# Patient Record
Sex: Female | Born: 1970 | Race: White | Hispanic: No | Marital: Married | Smoking: Former smoker
Health system: Southern US, Community
[De-identification: ages and names within clinical notes are randomized; demographics above are authoritative.]

## PROBLEM LIST (undated history)

## (undated) DIAGNOSIS — E079 Disorder of thyroid, unspecified: Secondary | ICD-10-CM

## (undated) DIAGNOSIS — F419 Anxiety disorder, unspecified: Secondary | ICD-10-CM

## (undated) DIAGNOSIS — G971 Other reaction to spinal and lumbar puncture: Secondary | ICD-10-CM

## (undated) DIAGNOSIS — J069 Acute upper respiratory infection, unspecified: Secondary | ICD-10-CM

## (undated) DIAGNOSIS — K5190 Ulcerative colitis, unspecified, without complications: Secondary | ICD-10-CM

## (undated) DIAGNOSIS — R0600 Dyspnea, unspecified: Secondary | ICD-10-CM

## (undated) DIAGNOSIS — G43909 Migraine, unspecified, not intractable, without status migrainosus: Secondary | ICD-10-CM

## (undated) DIAGNOSIS — J45909 Unspecified asthma, uncomplicated: Secondary | ICD-10-CM

## (undated) DIAGNOSIS — E785 Hyperlipidemia, unspecified: Secondary | ICD-10-CM

## (undated) DIAGNOSIS — T4145XA Adverse effect of unspecified anesthetic, initial encounter: Secondary | ICD-10-CM

## (undated) DIAGNOSIS — G4730 Sleep apnea, unspecified: Secondary | ICD-10-CM

## (undated) DIAGNOSIS — T783XXA Angioneurotic edema, initial encounter: Secondary | ICD-10-CM

## (undated) DIAGNOSIS — L509 Urticaria, unspecified: Secondary | ICD-10-CM

## (undated) DIAGNOSIS — T8859XA Other complications of anesthesia, initial encounter: Secondary | ICD-10-CM

## (undated) DIAGNOSIS — E039 Hypothyroidism, unspecified: Secondary | ICD-10-CM

## (undated) DIAGNOSIS — K219 Gastro-esophageal reflux disease without esophagitis: Secondary | ICD-10-CM

## (undated) DIAGNOSIS — D649 Anemia, unspecified: Secondary | ICD-10-CM

## (undated) DIAGNOSIS — I10 Essential (primary) hypertension: Secondary | ICD-10-CM

## (undated) HISTORY — DX: Ulcerative colitis, unspecified, without complications: K51.90

## (undated) HISTORY — DX: Anxiety disorder, unspecified: F41.9

## (undated) HISTORY — PX: BACK SURGERY: SHX140

## (undated) HISTORY — DX: Disorder of thyroid, unspecified: E07.9

## (undated) HISTORY — DX: Migraine, unspecified, not intractable, without status migrainosus: G43.909

## (undated) HISTORY — PX: CHOLECYSTECTOMY: SHX55

## (undated) HISTORY — PX: APPENDECTOMY: SHX54

## (undated) HISTORY — DX: Angioneurotic edema, initial encounter: T78.3XXA

## (undated) HISTORY — DX: Acute upper respiratory infection, unspecified: J06.9

## (undated) HISTORY — DX: Urticaria, unspecified: L50.9

---

## 1998-08-11 HISTORY — PX: SPINE SURGERY: SHX786

## 2000-08-11 HISTORY — PX: ABDOMINAL HYSTERECTOMY: SHX81

## 2012-06-25 ENCOUNTER — Other Ambulatory Visit: Payer: Self-pay | Admitting: Family Medicine

## 2012-06-25 DIAGNOSIS — N6320 Unspecified lump in the left breast, unspecified quadrant: Secondary | ICD-10-CM

## 2012-07-02 ENCOUNTER — Ambulatory Visit
Admission: RE | Admit: 2012-07-02 | Discharge: 2012-07-02 | Disposition: A | Discharge status: Home / Self Care | Payer: BC Managed Care – PPO | Source: Ambulatory Visit | Attending: Family Medicine | Admitting: Family Medicine

## 2012-07-02 DIAGNOSIS — N6320 Unspecified lump in the left breast, unspecified quadrant: Secondary | ICD-10-CM

## 2013-06-22 DIAGNOSIS — J45909 Unspecified asthma, uncomplicated: Secondary | ICD-10-CM | POA: Insufficient documentation

## 2013-06-22 DIAGNOSIS — E039 Hypothyroidism, unspecified: Secondary | ICD-10-CM | POA: Insufficient documentation

## 2013-06-22 DIAGNOSIS — G43109 Migraine with aura, not intractable, without status migrainosus: Secondary | ICD-10-CM | POA: Insufficient documentation

## 2013-06-22 DIAGNOSIS — Z90710 Acquired absence of both cervix and uterus: Secondary | ICD-10-CM | POA: Insufficient documentation

## 2013-06-22 DIAGNOSIS — K5190 Ulcerative colitis, unspecified, without complications: Secondary | ICD-10-CM | POA: Insufficient documentation

## 2013-07-04 DIAGNOSIS — N751 Abscess of Bartholin's gland: Secondary | ICD-10-CM | POA: Insufficient documentation

## 2015-07-20 ENCOUNTER — Encounter: Payer: Self-pay | Admitting: Family Medicine

## 2015-07-20 ENCOUNTER — Ambulatory Visit (INDEPENDENT_AMBULATORY_CARE_PROVIDER_SITE_OTHER): Payer: Worker's Compensation | Admitting: Family Medicine

## 2015-07-20 VITALS — BP 124/79 | HR 74 | Temp 96.7°F | Ht 64.0 in | Wt 265.8 lb

## 2015-07-20 DIAGNOSIS — M545 Low back pain, unspecified: Secondary | ICD-10-CM

## 2015-07-20 MED ORDER — CYCLOBENZAPRINE HCL 10 MG PO TABS: 10.0000 mg | ORAL_TABLET | Freq: Three times a day (TID) | ORAL | Status: DC | PRN
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
## 2015-07-25 ENCOUNTER — Ambulatory Visit (INDEPENDENT_AMBULATORY_CARE_PROVIDER_SITE_OTHER): Payer: Worker's Compensation | Admitting: Family Medicine

## 2015-07-25 ENCOUNTER — Ambulatory Visit (INDEPENDENT_AMBULATORY_CARE_PROVIDER_SITE_OTHER): Payer: Worker's Compensation

## 2015-07-25 ENCOUNTER — Encounter: Payer: Self-pay | Admitting: Family Medicine

## 2015-07-25 VITALS — BP 119/78 | HR 105 | Temp 97.9°F | Ht 64.0 in | Wt 266.4 lb

## 2015-07-25 DIAGNOSIS — M5441 Lumbago with sciatica, right side: Secondary | ICD-10-CM

## 2015-07-25 MED ORDER — HYDROCODONE-ACETAMINOPHEN 10-325 MG PO TABS: 1.0000 | ORAL_TABLET | Freq: Three times a day (TID) | ORAL | Status: DC | PRN
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
## 2015-07-31 ENCOUNTER — Telehealth: Payer: Self-pay | Admitting: Family Medicine

## 2015-07-31 NOTE — Telephone Encounter (Signed)
Would like to try something different for pain Vicodin is causing nausea and headache Please advise

## 2015-08-01 MED ORDER — TRAMADOL HCL 50 MG PO TABS: 50.0000 mg | ORAL_TABLET | Freq: Four times a day (QID) | ORAL | Status: DC | PRN

## 2015-08-01 NOTE — Telephone Encounter (Signed)
[facility] Hyacinth MeekerMiller

## 2015-08-01 NOTE — Telephone Encounter (Signed)
I don't recall if she has tried tramadol before. We could try that 50 mg 4 times a day as needed for pain if she wants to.

## 2015-09-20 ENCOUNTER — Telehealth: Payer: Self-pay | Admitting: Family Medicine

## 2017-03-16 DIAGNOSIS — G4733 Obstructive sleep apnea (adult) (pediatric): Secondary | ICD-10-CM | POA: Insufficient documentation

## 2017-04-24 ENCOUNTER — Other Ambulatory Visit: Payer: Self-pay | Admitting: Rehabilitation

## 2017-04-24 DIAGNOSIS — M47816 Spondylosis without myelopathy or radiculopathy, lumbar region: Secondary | ICD-10-CM

## 2017-05-06 ENCOUNTER — Ambulatory Visit
Admission: RE | Admit: 2017-05-06 | Discharge: 2017-05-06 | Disposition: A | Discharge status: Home / Self Care | Payer: BC Managed Care – PPO | Source: Ambulatory Visit | Attending: Rehabilitation | Admitting: Rehabilitation

## 2017-05-06 DIAGNOSIS — M47816 Spondylosis without myelopathy or radiculopathy, lumbar region: Secondary | ICD-10-CM

## 2017-05-06 MED ORDER — GADOBENATE DIMEGLUMINE 529 MG/ML IV SOLN
20.0000 mL | Freq: Once | INTRAVENOUS | Status: AC | PRN
  Administered 2017-05-06: 20 mL via INTRAVENOUS

## 2017-08-11 HISTORY — PX: BACK SURGERY: SHX140

## 2018-08-19 ENCOUNTER — Other Ambulatory Visit: Payer: Self-pay | Admitting: Orthopedic Surgery

## 2018-08-24 NOTE — Pre-Procedure Instructions (Signed)
Julie Hess  08/24/2018      [facility] DRUG STORE #[id number] [place], [place] - [address] AT [facility] [address] MA[address] [place] [address] Phone: [phone] Fax: [phone]    Your procedure is scheduled on January 16  Report to [facility] Admitting at [employer] A.M. per dr. Yevette Hess  Call this number if you have problems the morning of surgery:  [phone]   Remember:  Do not eat or drink after midnight.      Take these medicines the morning of surgery with A SIP OF WATER  busPIRone (BUSPAR)  fluticasone (FLOVENT HFA) levothyroxine (SYNTHROID, LEVOTHROID) omeprazole (PRILOSEC OTC)  PROAIR HFA  7 days prior to surgery STOP taking any Aspirin (unless otherwise instructed by your surgeon), Aleve, Naproxen, Ibuprofen, Motrin, Advil, Goody's, BC's, all herbal medications, fish oil, and all vitamins sulfaSALAzine (AZULFIDINE)     Do not wear jewelry, make-up or nail polish.  Do not wear lotions, powders, or perfumes, or deodorant.  Do not shave 48 hours prior to surgery.    Do not bring valuables to the hospital.  [facility] is not responsible for any belongings or valuables.  Contacts, dentures or bridgework may not be worn into surgery.  Leave your suitcase in the car.  After surgery it may be brought to your room.  For patients admitted to the hospital, discharge time will be determined by your treatment team.  Patients discharged the day of surgery will not be allowed to drive home.    Special instructions:   - Preparing For Surgery  Before surgery, you can play an important role. Because skin is not sterile, your skin needs to be as free of germs as possible. You can reduce the number of germs on your skin by washing with CHG (chlorahexidine gluconate) Soap before surgery.  CHG is an antiseptic cleaner which kills germs and bonds with the skin to continue killing germs even after washing.    Oral Hygiene is  also important to reduce your risk of infection.  Remember - BRUSH YOUR TEETH THE MORNING OF SURGERY WITH YOUR REGULAR TOOTHPASTE  Please do not use if you have an allergy to CHG or antibacterial soaps. If your skin becomes reddened/irritated stop using the CHG.  Do not shave (including legs and underarms) for at least 48 hours prior to first CHG shower. It is OK to shave your face.  Please follow these instructions carefully.   1. Shower the NIGHT BEFORE SURGERY and the MORNING OF SURGERY with CHG.   2. If you chose to wash your hair, wash your hair first as usual with your normal shampoo.  3. After you shampoo, rinse your hair and body thoroughly to remove the shampoo.  4. Use CHG as you would any other liquid soap. You can apply CHG directly to the skin and wash gently with a scrungie or a clean washcloth.   5. Apply the CHG Soap to your body ONLY FROM THE NECK DOWN.  Do not use on open wounds or open sores. Avoid contact with your eyes, ears, mouth and genitals (private parts). Wash Face and genitals (private parts)  with your normal soap.  6. Wash thoroughly, paying special attention to the area where your surgery will be performed.  7. Thoroughly rinse your body with warm water from the neck down.  8. DO NOT shower/wash with your normal soap after using and rinsing off the CHG Soap.  9. Pat yourself dry with a CLEAN TOWEL.  10. Wear CLEAN PAJAMAS to bed the night before surgery, wear comfortable clothes the morning of surgery  11. Place CLEAN SHEETS on your bed the night of your first shower and DO NOT SLEEP WITH PETS.    Day of Surgery:  Do not apply any deodorants/lotions.  Please wear clean clothes to the hospital/surgery center.   Remember to brush your teeth WITH YOUR REGULAR TOOTHPASTE.    Please read over the following fact sheets that you were given.

## 2018-08-25 ENCOUNTER — Encounter (HOSPITAL_COMMUNITY): Payer: Self-pay

## 2018-08-25 ENCOUNTER — Other Ambulatory Visit: Payer: Self-pay

## 2018-08-25 ENCOUNTER — Encounter (HOSPITAL_COMMUNITY)
Admission: RE | Admit: 2018-08-25 | Discharge: 2018-08-25 | Disposition: A | Discharge status: Home / Self Care | Payer: No Typology Code available for payment source | Source: Ambulatory Visit | Attending: Orthopedic Surgery | Admitting: Orthopedic Surgery

## 2018-08-25 DIAGNOSIS — Z01812 Encounter for preprocedural laboratory examination: Secondary | ICD-10-CM | POA: Insufficient documentation

## 2018-08-25 HISTORY — DX: Adverse effect of unspecified anesthetic, initial encounter: T41.45XA

## 2018-08-25 HISTORY — DX: Hyperlipidemia, unspecified: E78.5

## 2018-08-25 HISTORY — DX: Dyspnea, unspecified: R06.00

## 2018-08-25 HISTORY — DX: Other complications of anesthesia, initial encounter: T88.59XA

## 2018-08-25 HISTORY — DX: Sleep apnea, unspecified: G47.30

## 2018-08-25 HISTORY — DX: Essential (primary) hypertension: I10

## 2018-08-25 HISTORY — DX: Gastro-esophageal reflux disease without esophagitis: K21.9

## 2018-08-25 HISTORY — DX: Hypothyroidism, unspecified: E03.9

## 2018-08-25 LAB — URINALYSIS, ROUTINE W REFLEX MICROSCOPIC
BILIRUBIN URINE: NEGATIVE
Glucose, UA: NEGATIVE mg/dL
HGB URINE DIPSTICK: NEGATIVE
Ketones, ur: NEGATIVE mg/dL
Nitrite: NEGATIVE
Protein, ur: NEGATIVE mg/dL
Specific Gravity, Urine: 1.006 (ref 1.005–1.030)
WBC, UA: >50 WBC/hpf — ABNORMAL HIGH (ref 0–5)
pH: 6 (ref 5.0–8.0)

## 2018-08-25 LAB — COMPREHENSIVE METABOLIC PANEL
ALT: 23 U/L (ref 0–44)
AST: 22 U/L (ref 15–41)
Albumin: 3.6 g/dL (ref 3.5–5.0)
Alkaline Phosphatase: 109 U/L (ref 38–126)
Anion gap: 11 (ref 5–15)
BILIRUBIN TOTAL: 0.5 mg/dL (ref 0.3–1.2)
BUN: 8 mg/dL (ref 6–20)
CO2: 25 mmol/L (ref 22–32)
Calcium: 9 mg/dL (ref 8.9–10.3)
Chloride: 105 mmol/L (ref 98–111)
Creatinine, Ser: 0.81 mg/dL (ref 0.44–1.00)
GFR calc Af Amer: >60 mL/min (ref >60)
GFR calc non Af Amer: >60 mL/min (ref >60)
Glucose, Bld: 94 mg/dL (ref 70–99)
Potassium: 3.9 mmol/L (ref 3.5–5.1)
Sodium: 141 mmol/L (ref 135–145)
Total Protein: 7.2 g/dL (ref 6.5–8.1)

## 2018-08-25 LAB — CBC WITH DIFFERENTIAL/PLATELET
Abs Immature Granulocytes: 0.04 10*3/uL (ref 0.00–0.07)
Basophils Absolute: 0.1 10*3/uL (ref 0.0–0.1)
Basophils Relative: 1 %
Eosinophils Absolute: 0.2 10*3/uL (ref 0.0–0.5)
Eosinophils Relative: 2 %
HCT: 41.6 % (ref 36.0–46.0)
Hemoglobin: 13.1 g/dL (ref 12.0–15.0)
Immature Granulocytes: 1 %
LYMPHS ABS: 2.8 10*3/uL (ref 0.7–4.0)
Lymphocytes Relative: 38 %
MCH: 28.4 pg (ref 26.0–34.0)
MCHC: 31.5 g/dL (ref 30.0–36.0)
MCV: 90 fL (ref 80.0–100.0)
Monocytes Absolute: 0.5 10*3/uL (ref 0.1–1.0)
Monocytes Relative: 6 %
NRBC: 0 % (ref 0.0–0.2)
Neutro Abs: 3.8 10*3/uL (ref 1.7–7.7)
Neutrophils Relative %: 52 %
Platelets: 307 10*3/uL (ref 150–400)
RBC: 4.62 MIL/uL (ref 3.87–5.11)
RDW: 14.8 % (ref 11.5–15.5)
WBC: 7.3 10*3/uL (ref 4.0–10.5)

## 2018-08-25 LAB — SURGICAL PCR SCREEN
MRSA, PCR: NEGATIVE
Staphylococcus aureus: NEGATIVE

## 2018-08-25 LAB — ABO/RH: ABO/RH(D): A POS

## 2018-08-25 LAB — TYPE AND SCREEN
ABO/RH(D): A POS
Antibody Screen: NEGATIVE

## 2018-08-25 LAB — APTT: aPTT: 32 seconds (ref 24–36)

## 2018-08-25 LAB — PROTIME-INR
INR: 1.02
Prothrombin Time: 13.3 seconds (ref 11.4–15.2)

## 2018-08-25 MED ORDER — VANCOMYCIN HCL 10 G IV SOLR
1500.0000 mg | INTRAVENOUS | Status: AC
  Administered 2018-08-26: 1500 mg via INTRAVENOUS
  Filled 2018-08-25: qty 1500

## 2018-08-25 NOTE — Progress Notes (Signed)
Anesthesia Chart Review:  Case:  [id number] Date/Time:  08/26/18 1419   Procedure:  ANTERIOR CERVICAL DECOMPRESSION FUSION, CERVICAL 4-5, CERVICAL 5-6, CERVICAL 6-7 WITH INSTRUMENTATION AND ALLOGRAFT (N/A )   Anesthesia type:  General   Pre-op diagnosis:  RIGHT ARM PAIN ALMOST 3 MONTHS AFTER HER INJURY MRI DOES REVEAL DEGENERATIVE CHANGES WITH COMPRESSION OF THE SPINAL CORD IDENTIFIED AT C4-5, C5-6 AND C6-7   Location:  [facility] OR ROOM 05 / [facility] OR   Surgeon:  Estill Bambergumonski, Mark, MD      DISCUSSION: Patient is a 48 year old female scheduled for the above procedure.   History includes former smoker (quit '06), ulcerative colitis, exertional dyspnea, HTN, OSA (CPAP), hypothyroidism, GERD, HLD, hysterectomy 2002, back surgery 2019.  Patient was seen by cardiologist Dr. Yetta Barreinehart for evaluation of chest pain. She had an abnormal stress echo in December that (by notes) showed a moderate size post exercise wall motion abnormality in the anteroseptal wall suggestive of myocardail ischemia. He saw her in follow-up with no additional testing ordered. He started her on ASA (on hold for surgery) and losartan with plans for 3 month follow-up. Patient reports her symptoms improved once she was placed on Flovent. Due to the abnormal stress echo I called and spoke with Dr. Yetta Barreinehart personally. He felt patient was "stable" to proceed with surgery and did not recommend that she have a cardiac cath prior to surgery.    Above reviewed with anesthesiologist Autumn PattyFitzgerald, Edmond, MD. Cardiologist did not recommend additional testing prior to surgery. Patient denied SOB, cough, fever, and chest pain at PAT. If patient without acute changes then would anticipate proceeding as planned, but definitive plan following her assigned anesthesiologist evaluation on the day of surgery.  Still awaiting formal copy of recent stress echo and [facility]    VS: BP 124/66   Pulse 74   Temp 36.7 C   Resp 20   Ht 5\' 5"  (1.651 m)    Wt (!) 137.5 kg   SpO2 100%   BMI 50.46 kg/m    PROVIDERS: Rolan Buccoompton, Kimberly D., PA-C is PCP - Chapman Fitchinehart, Kenneth, MD is cardiologist [facility]). He first saw her on 06/14/18 for evaluation for chest pain--"describes exertional chest discomfort with moderate exertion." He ordered a BNP, D-dimer, EKG, and stress echo. Last visit 07/27/18 for follow-up studies. BNP 21.7, D-dimer elevated at 0.64 which lead to a VQ scan which was low probability for PE. According to his note, her stress echo was abnormal. He started her on losartan, ASA and recommended 3 month follow-up. - [facility].   LABS: Preoperative labs noted. CBC, CMET, PT/PTT WNL. UA showed moderate leukocytes, many bacteria (message left for Lupita LeashDonna at Dr. Marshell Levanumonski's office).  (all labs ordered are listed, but only abnormal results are displayed)  Labs Reviewed  URINALYSIS, ROUTINE W REFLEX MICROSCOPIC - Abnormal; Notable for the following components:      Result Value   APPearance HAZY (*)    Leukocytes, UA MODERATE (*)    WBC, UA >50 (*)    Bacteria, UA MANY (*)    All other components within normal limits  SURGICAL PCR SCREEN  APTT  CBC WITH DIFFERENTIAL/PLATELET  COMPREHENSIVE METABOLIC PANEL  PROTIME-INR  TYPE AND SCREEN  ABO/RH    IMAGES: CXR 06/24/18 ([facility]) FINDINGS:  No acute infiltrate, pleural effusion or pneumothorax. The cardiomediastinal silhouette is normal in size and contour. IMPRESSION: No acute cardiopulmonary disease.  VQ  scan 06/24/18 [facility]): IMPRESSION: 1. 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 C4-5 mild central canal stenosis with mild left neural foraminal narrowing.  MRI L-spine 05/27/18: IMPRESSION: Left laminotomy decompression at L4-5 and L5-S1 with well-maintained central canal and neuroforamina.  There are fluid collection seen in the laminectomy defect measuring 3.4 x 2.6 x 4.6 cm as well as more posteriorly in the subcutaneous fat measuring 1.5  x 1.4 x 4.6 cm. These collections may reflect seroma however sterility is indeterminate   EKG: 06/14/18 [facility]): Tracing requested, but according to Result Impression: SR, low voltage in precordial leads.    CV: Stress Echo 07/13/18 [facility]): Report requested, but according to Dr. Brigitte Pulseinehart's 07/27/18 note, "Stress echocardiogram 07/2018. Duke treadmill score +5. Normal LV EF 50-55%. Abnormal stress echo. Moderate size post exercise wall motion abnormality in the anteroseptal wall suggestive of myocardail ischemia.   Past Medical History:  Diagnosis Date  . Anxiety   . Complication of anesthesia    Hard time waking up after C-Section 1997, but tolerated anesthesia since then no issues  . Dyspnea    with activity  . GERD (gastroesophageal reflux disease)   . Hyperlipidemia   . Hypertension   . Hypothyroidism   . Migraines    migraines  . Sleep apnea   . Thyroid disease   . Ulcerative colitis, chronic (HCC)     Past Surgical History:  Procedure Laterality Date  . ABDOMINAL HYSTERECTOMY  2002  . APPENDECTOMY    . BACK SURGERY  2019   lumbar fusion  . BACK SURGERY     discetomy  . CESAREAN SECTION  1996  . CHOLECYSTECTOMY    . SPINE SURGERY  [id number]   lumbar    MEDICATIONS: . aspirin EC 81 MG tablet  . busPIRone (BUSPAR) 10 MG tablet  . ELDERBERRY PO  . estradiol (VIVELLE-DOT) 0.05 MG/24HR patch  . fluticasone (FLOVENT HFA) 44 MCG/ACT inhaler  . levothyroxine (SYNTHROID, LEVOTHROID) 75 MCG tablet  . losartan (COZAAR) 50 MG tablet  . omeprazole (PRILOSEC OTC) 20 MG tablet  . pravastatin (PRAVACHOL) 40 MG tablet  . PROAIR HFA 108 (90 BASE) MCG/ACT inhaler  . sulfaSALAzine (AZULFIDINE) 500 MG tablet   No current facility-administered medications for this encounter.     Shonna ChockAllison Marge Vandermeulen,  PA-C Surgical Short Stay/Anesthesiology [facility] Phone [phone] [facility] Phone [phone] 08/25/2018 1:40 PM

## 2018-08-25 NOTE — Progress Notes (Signed)
Dr. Marshell Levanumonski's office notified about patient's abnormal UA. Spoke with Joni ReiningNicole and she stated she will notify Dr. Marshell Levanumonski's about result.

## 2018-08-25 NOTE — Progress Notes (Addendum)
PCP: Osborne Casco PA-C, sees q69months  Cardiologist: Dr. Chapman Fitch  EKG: 06-14-18- requested tracing CXR: 06-24-18, [facility] Every[facility] ECHO: 07-13-18 C. E. Stress Test: 07-13-18 C. E. Cardiac Cath: Denies Sleep Study: 03-2017, Care Everywhere--wears CPAP at night, range of settings between 11-13  [facility] and left message to request cardiac clearance if needed.   Orvan Seen with anesthesia called for review.  Per pt, due to recent colonoscopy with biopsies, she cannot have NSAIDS until 08-31-2018 per GI Dr. Gasper Lloyd.   Patient denies shortness of breath, fever, cough, and chest pain at PAT appointment.  Patient verbalized understanding of instructions provided today at the PAT appointment.  Patient asked to review instructions at home and day of surgery.

## 2018-08-25 NOTE — Pre-Procedure Instructions (Signed)
Julie Hess  08/25/2018      [facility] DRUG STORE #[id number] [name], [place] - [address] AT [facility] [address] [place] [address] Phone: [phone] Fax: [phone]    Your procedure is scheduled on January 16  Report to [facility] Admitting at [employer] A.M. per Dr. Yevette Edwards  Call this number if you have problems the morning of surgery:  [phone]   Remember:  Do not eat or drink after midnight.      Take these medicines the morning of surgery with A SIP OF WATER  busPIRone (BUSPAR)  fluticasone (FLOVENT HFA) levothyroxine (SYNTHROID, LEVOTHROID) omeprazole (PRILOSEC OTC)  PROAIR HFA as needed and bring with you the day of surgery  As of today, STOP taking any Aspirin (unless otherwise instructed by your surgeon), Aleve, Naproxen, Ibuprofen, Motrin, Advil, Goody's, BC's, all herbal medications, fish oil, and all vitamins sulfaSALAzine (AZULFIDINE)     Do not wear jewelry, make-up or nail polish.  Do not wear lotions, powders, or perfumes, or deodorant.  Do not shave 48 hours prior to surgery.    Do not bring valuables to the hospital.  [facility] is not responsible for any belongings or valuables.  Contacts, dentures or bridgework may not be worn into surgery.  Leave your suitcase in the car.  After surgery it may be brought to your room.  For patients admitted to the hospital, discharge time will be determined by your treatment team.  Patients discharged the day of surgery will not be allowed to drive home.    Special instructions:   [place]- Preparing For Surgery  Before surgery, you can play an important role. Because skin is not sterile, your skin needs to be as free of germs as possible. You can reduce the number of germs on your skin by washing with CHG (chlorahexidine gluconate) Soap before surgery.  CHG is an antiseptic cleaner which kills germs and bonds with the skin to continue killing germs  even after washing.    Oral Hygiene is also important to reduce your risk of infection.  Remember - BRUSH YOUR TEETH THE MORNING OF SURGERY WITH YOUR REGULAR TOOTHPASTE  Please do not use if you have an allergy to CHG or antibacterial soaps. If your skin becomes reddened/irritated stop using the CHG.  Do not shave (including legs and underarms) for at least 48 hours prior to first CHG shower. It is OK to shave your face.  Please follow these instructions carefully.   1. Shower the NIGHT BEFORE SURGERY and the MORNING OF SURGERY with CHG.   2. If you chose to wash your hair, wash your hair first as usual with your normal shampoo.  3. After you shampoo, rinse your hair and body thoroughly to remove the shampoo.  4. Use CHG as you would any other liquid soap. You can apply CHG directly to the skin and wash gently with a scrungie or a clean washcloth.   5. Apply the CHG Soap to your body ONLY FROM THE NECK DOWN.  Do not use on open wounds or open sores. Avoid contact with your eyes, ears, mouth and genitals (private parts). Wash Face and genitals (private parts)  with your normal soap.  6. Wash thoroughly, paying special attention to the area where your surgery will be performed.  7. Thoroughly rinse your body with warm water from the neck down.  8. DO NOT shower/wash with your normal soap  after using and rinsing off the CHG Soap.  9. Pat yourself dry with a CLEAN TOWEL.  10. Wear CLEAN PAJAMAS to bed the night before surgery, wear comfortable clothes the morning of surgery  11. Place CLEAN SHEETS on your bed the night of your first shower and DO NOT SLEEP WITH PETS.    Day of Surgery:  Do not apply any deodorants/lotions.  Please wear clean clothes to the hospital/surgery center.   Remember to brush your teeth WITH YOUR REGULAR TOOTHPASTE.    Please read over the following fact sheets that you were given.

## 2018-08-26 ENCOUNTER — Ambulatory Visit (HOSPITAL_COMMUNITY): Payer: No Typology Code available for payment source | Admitting: Vascular Surgery

## 2018-08-26 ENCOUNTER — Ambulatory Visit (HOSPITAL_COMMUNITY): Payer: No Typology Code available for payment source

## 2018-08-26 ENCOUNTER — Encounter (HOSPITAL_COMMUNITY): Payer: Self-pay | Admitting: Certified Registered Nurse Anesthetist

## 2018-08-26 ENCOUNTER — Encounter (HOSPITAL_COMMUNITY)
Admission: RE | Disposition: A | Discharge status: Home / Self Care | Payer: Self-pay | Source: Home / Self Care | Attending: Orthopedic Surgery

## 2018-08-26 ENCOUNTER — Observation Stay (HOSPITAL_COMMUNITY)
Admission: RE | Admit: 2018-08-26 | Discharge: 2018-08-27 | Disposition: A | Discharge status: Home / Self Care | Payer: No Typology Code available for payment source | Attending: Orthopedic Surgery | Admitting: Orthopedic Surgery

## 2018-08-26 ENCOUNTER — Ambulatory Visit (HOSPITAL_COMMUNITY): Payer: No Typology Code available for payment source | Admitting: Certified Registered Nurse Anesthetist

## 2018-08-26 DIAGNOSIS — Z79890 Hormone replacement therapy (postmenopausal): Secondary | ICD-10-CM | POA: Insufficient documentation

## 2018-08-26 DIAGNOSIS — K219 Gastro-esophageal reflux disease without esophagitis: Secondary | ICD-10-CM | POA: Insufficient documentation

## 2018-08-26 DIAGNOSIS — F419 Anxiety disorder, unspecified: Secondary | ICD-10-CM | POA: Insufficient documentation

## 2018-08-26 DIAGNOSIS — M79601 Pain in right arm: Secondary | ICD-10-CM | POA: Diagnosis present

## 2018-08-26 DIAGNOSIS — Z7951 Long term (current) use of inhaled steroids: Secondary | ICD-10-CM | POA: Diagnosis not present

## 2018-08-26 DIAGNOSIS — I10 Essential (primary) hypertension: Secondary | ICD-10-CM | POA: Insufficient documentation

## 2018-08-26 DIAGNOSIS — E785 Hyperlipidemia, unspecified: Secondary | ICD-10-CM | POA: Diagnosis not present

## 2018-08-26 DIAGNOSIS — Z9989 Dependence on other enabling machines and devices: Secondary | ICD-10-CM | POA: Insufficient documentation

## 2018-08-26 DIAGNOSIS — Z79899 Other long term (current) drug therapy: Secondary | ICD-10-CM | POA: Diagnosis not present

## 2018-08-26 DIAGNOSIS — M5412 Radiculopathy, cervical region: Secondary | ICD-10-CM | POA: Diagnosis not present

## 2018-08-26 DIAGNOSIS — Z87891 Personal history of nicotine dependence: Secondary | ICD-10-CM | POA: Insufficient documentation

## 2018-08-26 DIAGNOSIS — E039 Hypothyroidism, unspecified: Secondary | ICD-10-CM | POA: Diagnosis not present

## 2018-08-26 DIAGNOSIS — Z6841 Body Mass Index (BMI) 40.0 and over, adult: Secondary | ICD-10-CM | POA: Insufficient documentation

## 2018-08-26 DIAGNOSIS — M4802 Spinal stenosis, cervical region: Principal | ICD-10-CM | POA: Insufficient documentation

## 2018-08-26 DIAGNOSIS — Z7982 Long term (current) use of aspirin: Secondary | ICD-10-CM | POA: Diagnosis not present

## 2018-08-26 DIAGNOSIS — G4733 Obstructive sleep apnea (adult) (pediatric): Secondary | ICD-10-CM | POA: Diagnosis not present

## 2018-08-26 DIAGNOSIS — Z419 Encounter for procedure for purposes other than remedying health state, unspecified: Secondary | ICD-10-CM

## 2018-08-26 DIAGNOSIS — M5410 Radiculopathy, site unspecified: Secondary | ICD-10-CM | POA: Diagnosis present

## 2018-08-26 HISTORY — PX: ANTERIOR CERVICAL DECOMPRESSION/DISCECTOMY FUSION 4 LEVEL/HARDWARE REMOVAL: SHX5551

## 2018-08-26 SURGERY — ANTERIOR CERVICAL DECOMPRESSION/DISCECTOMY FUSION 4 LEVEL/HARDWARE REMOVAL
Anesthesia: General

## 2018-08-26 MED ORDER — THROMBIN 20000 UNITS EX SOLR
CUTANEOUS | Status: DC | PRN
  Administered 2018-08-26: 20000 [IU] via TOPICAL

## 2018-08-26 MED ORDER — ONDANSETRON HCL 4 MG/2ML IJ SOLN: 4.0000 mg | Freq: Four times a day (QID) | INTRAMUSCULAR | Status: DC | PRN

## 2018-08-26 MED ORDER — ACETAMINOPHEN 325 MG PO TABS: 650.0000 mg | ORAL_TABLET | ORAL | Status: DC | PRN

## 2018-08-26 MED ORDER — OXYCODONE HCL 5 MG/5ML PO SOLN: 5.0000 mg | Freq: Once | ORAL | Status: DC | PRN

## 2018-08-26 MED ORDER — EPHEDRINE 5 MG/ML INJ
INTRAVENOUS | Status: AC
  Filled 2018-08-26: qty 10

## 2018-08-26 MED ORDER — SENNOSIDES-DOCUSATE SODIUM 8.6-50 MG PO TABS
1.0000 | ORAL_TABLET | Freq: Every evening | ORAL | Status: DC | PRN
  Filled 2018-08-26: qty 1

## 2018-08-26 MED ORDER — LEVOFLOXACIN 500 MG PO TABS
500.0000 mg | ORAL_TABLET | Freq: Every day | ORAL | Status: DC
  Administered 2018-08-27: 500 mg via ORAL
  Filled 2018-08-26: qty 1

## 2018-08-26 MED ORDER — BISACODYL 5 MG PO TBEC: 5.0000 mg | DELAYED_RELEASE_TABLET | Freq: Every day | ORAL | Status: DC | PRN

## 2018-08-26 MED ORDER — FENTANYL CITRATE (PF) 100 MCG/2ML IJ SOLN
INTRAMUSCULAR | Status: DC | PRN
  Administered 2018-08-26: 150 ug via INTRAVENOUS
  Administered 2018-08-26 (×2): 50 ug via INTRAVENOUS

## 2018-08-26 MED ORDER — SUCCINYLCHOLINE CHLORIDE 200 MG/10ML IV SOSY
PREFILLED_SYRINGE | INTRAVENOUS | Status: AC
  Filled 2018-08-26: qty 10

## 2018-08-26 MED ORDER — HYDROMORPHONE HCL 1 MG/ML IJ SOLN
0.2500 mg | INTRAMUSCULAR | Status: DC | PRN
  Administered 2018-08-26 (×3): 0.5 mg via INTRAVENOUS

## 2018-08-26 MED ORDER — BUSPIRONE HCL 10 MG PO TABS
10.0000 mg | ORAL_TABLET | Freq: Two times a day (BID) | ORAL | Status: DC
  Administered 2018-08-26 – 2018-08-27 (×2): 10 mg via ORAL
  Filled 2018-08-26 (×3): qty 1

## 2018-08-26 MED ORDER — ALBUTEROL SULFATE (2.5 MG/3ML) 0.083% IN NEBU
2.5000 mg | INHALATION_SOLUTION | RESPIRATORY_TRACT | Status: DC
  Filled 2018-08-26: qty 3

## 2018-08-26 MED ORDER — LACTATED RINGERS IV SOLN
INTRAVENOUS | Status: DC
  Administered 2018-08-26: 12:00:00 via INTRAVENOUS

## 2018-08-26 MED ORDER — OMEPRAZOLE MAGNESIUM 20 MG PO TBEC: 20.0000 mg | DELAYED_RELEASE_TABLET | Freq: Every day | ORAL | Status: DC

## 2018-08-26 MED ORDER — SODIUM CHLORIDE 0.9 % IV SOLN
INTRAVENOUS | Status: DC | PRN
  Administered 2018-08-26: 40 ug/min via INTRAVENOUS

## 2018-08-26 MED ORDER — ALBUTEROL SULFATE HFA 108 (90 BASE) MCG/ACT IN AERS
2.0000 | INHALATION_SPRAY | RESPIRATORY_TRACT | Status: DC | PRN
  Filled 2018-08-26: qty 6.7

## 2018-08-26 MED ORDER — ONDANSETRON HCL 4 MG/2ML IJ SOLN
INTRAMUSCULAR | Status: DC | PRN
  Administered 2018-08-26: 4 mg via INTRAVENOUS

## 2018-08-26 MED ORDER — FENTANYL CITRATE (PF) 250 MCG/5ML IJ SOLN
INTRAMUSCULAR | Status: AC
  Filled 2018-08-26: qty 5

## 2018-08-26 MED ORDER — POVIDONE-IODINE 7.5 % EX SOLN
Freq: Once | CUTANEOUS | Status: DC
  Filled 2018-08-26: qty 118

## 2018-08-26 MED ORDER — ROCURONIUM BROMIDE 10 MG/ML (PF) SYRINGE
PREFILLED_SYRINGE | INTRAVENOUS | Status: DC | PRN
  Administered 2018-08-26: 80 mg via INTRAVENOUS
  Administered 2018-08-26: 20 mg via INTRAVENOUS

## 2018-08-26 MED ORDER — ACETAMINOPHEN 650 MG RE SUPP: 650.0000 mg | RECTAL | Status: DC | PRN

## 2018-08-26 MED ORDER — OXYCODONE HCL 5 MG PO TABS: 5.0000 mg | ORAL_TABLET | Freq: Once | ORAL | Status: DC | PRN

## 2018-08-26 MED ORDER — ESMOLOL HCL 100 MG/10ML IV SOLN
INTRAVENOUS | Status: DC | PRN
  Administered 2018-08-26 (×2): 10 mg via INTRAVENOUS

## 2018-08-26 MED ORDER — BUPIVACAINE-EPINEPHRINE 0.25% -1:200000 IJ SOLN
INTRAMUSCULAR | Status: DC | PRN
  Administered 2018-08-26: 10 mL

## 2018-08-26 MED ORDER — BUDESONIDE 0.5 MG/2ML IN SUSP
0.5000 mg | Freq: Two times a day (BID) | RESPIRATORY_TRACT | Status: DC
  Administered 2018-08-27: 0.5 mg via RESPIRATORY_TRACT
  Filled 2018-08-26 (×3): qty 2

## 2018-08-26 MED ORDER — DOCUSATE SODIUM 100 MG PO CAPS
100.0000 mg | ORAL_CAPSULE | Freq: Two times a day (BID) | ORAL | Status: DC
  Administered 2018-08-26 – 2018-08-27 (×2): 100 mg via ORAL
  Filled 2018-08-26 (×2): qty 1

## 2018-08-26 MED ORDER — SODIUM CHLORIDE 0.9% FLUSH: 3.0000 mL | Freq: Two times a day (BID) | INTRAVENOUS | Status: DC

## 2018-08-26 MED ORDER — ONDANSETRON HCL 4 MG/2ML IJ SOLN
INTRAMUSCULAR | Status: AC
  Administered 2018-08-26: 4 mg via INTRAVENOUS
  Filled 2018-08-26: qty 2

## 2018-08-26 MED ORDER — ONDANSETRON HCL 4 MG/2ML IJ SOLN
4.0000 mg | Freq: Once | INTRAMUSCULAR | Status: AC | PRN
  Administered 2018-08-26: 4 mg via INTRAVENOUS

## 2018-08-26 MED ORDER — BUPIVACAINE-EPINEPHRINE (PF) 0.25% -1:200000 IJ SOLN
INTRAMUSCULAR | Status: AC
  Filled 2018-08-26: qty 30

## 2018-08-26 MED ORDER — ONDANSETRON HCL 4 MG/2ML IJ SOLN
INTRAMUSCULAR | Status: AC
  Filled 2018-08-26: qty 2

## 2018-08-26 MED ORDER — MIDAZOLAM HCL 2 MG/2ML IJ SOLN
INTRAMUSCULAR | Status: AC
  Filled 2018-08-26: qty 2

## 2018-08-26 MED ORDER — PROPOFOL 10 MG/ML IV BOLUS
INTRAVENOUS | Status: AC
  Filled 2018-08-26: qty 20

## 2018-08-26 MED ORDER — THROMBIN (RECOMBINANT) 20000 UNITS EX SOLR
CUTANEOUS | Status: AC
  Filled 2018-08-26: qty 20000

## 2018-08-26 MED ORDER — ONDANSETRON HCL 4 MG PO TABS: 4.0000 mg | ORAL_TABLET | Freq: Four times a day (QID) | ORAL | Status: DC | PRN

## 2018-08-26 MED ORDER — PHENYLEPHRINE 40 MCG/ML (10ML) SYRINGE FOR IV PUSH (FOR BLOOD PRESSURE SUPPORT)
PREFILLED_SYRINGE | INTRAVENOUS | Status: DC | PRN
  Administered 2018-08-26: 200 ug via INTRAVENOUS

## 2018-08-26 MED ORDER — SUGAMMADEX SODIUM 200 MG/2ML IV SOLN
INTRAVENOUS | Status: DC | PRN
  Administered 2018-08-26: 300 mg via INTRAVENOUS

## 2018-08-26 MED ORDER — SODIUM CHLORIDE 0.9 % IV SOLN: 250.0000 mL | INTRAVENOUS | Status: DC

## 2018-08-26 MED ORDER — VANCOMYCIN HCL 10 G IV SOLR
1250.0000 mg | Freq: Once | INTRAVENOUS | Status: AC
  Administered 2018-08-27: 1250 mg via INTRAVENOUS
  Filled 2018-08-26 (×2): qty 1250

## 2018-08-26 MED ORDER — ALUM & MAG HYDROXIDE-SIMETH 200-200-20 MG/5ML PO SUSP: 30.0000 mL | Freq: Four times a day (QID) | ORAL | Status: DC | PRN

## 2018-08-26 MED ORDER — ZOLPIDEM TARTRATE 5 MG PO TABS: 5.0000 mg | ORAL_TABLET | Freq: Every evening | ORAL | Status: DC | PRN

## 2018-08-26 MED ORDER — PRAVASTATIN SODIUM 40 MG PO TABS
40.0000 mg | ORAL_TABLET | Freq: Every day | ORAL | Status: DC
  Administered 2018-08-26: 40 mg via ORAL
  Filled 2018-08-26: qty 1

## 2018-08-26 MED ORDER — PROPOFOL 10 MG/ML IV BOLUS
INTRAVENOUS | Status: DC | PRN
  Administered 2018-08-26: 150 mg via INTRAVENOUS

## 2018-08-26 MED ORDER — MENTHOL 3 MG MT LOZG
1.0000 | LOZENGE | OROMUCOSAL | Status: DC | PRN
  Filled 2018-08-26: qty 9

## 2018-08-26 MED ORDER — MIDAZOLAM HCL 5 MG/5ML IJ SOLN
INTRAMUSCULAR | Status: DC | PRN
  Administered 2018-08-26: 2 mg via INTRAVENOUS

## 2018-08-26 MED ORDER — LOSARTAN POTASSIUM 50 MG PO TABS
50.0000 mg | ORAL_TABLET | Freq: Every day | ORAL | Status: DC
  Administered 2018-08-26: 50 mg via ORAL
  Filled 2018-08-26: qty 1

## 2018-08-26 MED ORDER — SULFASALAZINE 500 MG PO TABS
500.0000 mg | ORAL_TABLET | Freq: Every day | ORAL | Status: DC
  Administered 2018-08-27: 500 mg via ORAL
  Filled 2018-08-26 (×2): qty 1

## 2018-08-26 MED ORDER — FLEET ENEMA 7-19 GM/118ML RE ENEM: 1.0000 | ENEMA | Freq: Once | RECTAL | Status: DC | PRN

## 2018-08-26 MED ORDER — LIDOCAINE HCL (CARDIAC) PF 100 MG/5ML IV SOSY
PREFILLED_SYRINGE | INTRAVENOUS | Status: DC | PRN
  Administered 2018-08-26: 100 mg via INTRAVENOUS

## 2018-08-26 MED ORDER — HYDROMORPHONE HCL 1 MG/ML IJ SOLN
INTRAMUSCULAR | Status: AC
  Administered 2018-08-26: 0.5 mg via INTRAVENOUS
  Filled 2018-08-26: qty 1

## 2018-08-26 MED ORDER — ALBUTEROL SULFATE (2.5 MG/3ML) 0.083% IN NEBU: 2.5000 mg | INHALATION_SOLUTION | RESPIRATORY_TRACT | Status: DC | PRN

## 2018-08-26 MED ORDER — OXYCODONE-ACETAMINOPHEN 5-325 MG PO TABS
ORAL_TABLET | ORAL | Status: AC
  Administered 2018-08-26: 2 via ORAL
  Filled 2018-08-26: qty 2

## 2018-08-26 MED ORDER — DIAZEPAM 5 MG PO TABS
5.0000 mg | ORAL_TABLET | Freq: Four times a day (QID) | ORAL | Status: DC | PRN
  Administered 2018-08-26 – 2018-08-27 (×2): 5 mg via ORAL
  Filled 2018-08-26 (×2): qty 1

## 2018-08-26 MED ORDER — DEXAMETHASONE SODIUM PHOSPHATE 10 MG/ML IJ SOLN
INTRAMUSCULAR | Status: DC | PRN
  Administered 2018-08-26: 10 mg via INTRAVENOUS

## 2018-08-26 MED ORDER — SODIUM CHLORIDE 0.9% FLUSH: 3.0000 mL | INTRAVENOUS | Status: DC | PRN

## 2018-08-26 MED ORDER — LEVOTHYROXINE SODIUM 75 MCG PO TABS
75.0000 ug | ORAL_TABLET | Freq: Every day | ORAL | Status: DC
  Administered 2018-08-27: 75 ug via ORAL
  Filled 2018-08-26: qty 1

## 2018-08-26 MED ORDER — PHENOL 1.4 % MT LIQD: 1.0000 | OROMUCOSAL | Status: DC | PRN

## 2018-08-26 MED ORDER — OXYCODONE-ACETAMINOPHEN 5-325 MG PO TABS
1.0000 | ORAL_TABLET | ORAL | Status: DC | PRN
  Administered 2018-08-26 – 2018-08-27 (×5): 2 via ORAL
  Filled 2018-08-26 (×4): qty 2

## 2018-08-26 MED ORDER — LACTATED RINGERS IV SOLN
INTRAVENOUS | Status: DC | PRN
  Administered 2018-08-26: 13:00:00 via INTRAVENOUS

## 2018-08-26 MED ORDER — ESTRADIOL 0.05 MG/24HR TD PTWK: 0.0500 mg | MEDICATED_PATCH | TRANSDERMAL | Status: DC

## 2018-08-26 MED ORDER — PANTOPRAZOLE SODIUM 40 MG IV SOLR
40.0000 mg | Freq: Every day | INTRAVENOUS | Status: DC
  Administered 2018-08-26: 40 mg via INTRAVENOUS
  Filled 2018-08-26: qty 40

## 2018-08-26 SURGICAL SUPPLY — 82 items
BENZOIN TINCTURE PRP APPL 2/3 (GAUZE/BANDAGES/DRESSINGS) ×3 IMPLANT
BIT DRILL NEURO 2X3.1 SFT TUCH (MISCELLANEOUS) ×1 IMPLANT
BIT DRILL SRG 14X2.2XFLT CHK (BIT) IMPLANT
BIT DRL SRG 14X2.2XFLT CHK (BIT) ×1
BLADE CLIPPER SURG (BLADE) ×3 IMPLANT
BLADE SURG 15 STRL LF DISP TIS (BLADE) ×1 IMPLANT
BLADE SURG 15 STRL SS (BLADE) ×2
BONE VIVIGEN FORMABLE 1.3CC (Bone Implant) ×6 IMPLANT
BUR MATCHSTICK NEURO 3.0 LAGG (BURR) IMPLANT
CARTRIDGE OIL MAESTRO DRILL (MISCELLANEOUS) ×1 IMPLANT
CLOSURE STERI-STRIP 1/2X4 (GAUZE/BANDAGES/DRESSINGS) ×1
CLOSURE WOUND 1/2 X4 (GAUZE/BANDAGES/DRESSINGS) ×1
CLSR STERI-STRIP ANTIMIC 1/2X4 (GAUZE/BANDAGES/DRESSINGS) ×1 IMPLANT
COLLAR CERV LO CONTOUR FIRM DE (SOFTGOODS) IMPLANT
CORDS BIPOLAR (ELECTRODE) ×3 IMPLANT
COVER SURGICAL LIGHT HANDLE (MISCELLANEOUS) ×3 IMPLANT
COVER WAND RF STERILE (DRAPES) ×3 IMPLANT
CRADLE DONUT ADULT HEAD (MISCELLANEOUS) ×3 IMPLANT
DIFFUSER DRILL AIR PNEUMATIC (MISCELLANEOUS) ×3 IMPLANT
DRAIN JACKSON RD 7FR 3/32 (WOUND CARE) ×3 IMPLANT
DRAPE C-ARM 42X72 X-RAY (DRAPES) ×3 IMPLANT
DRAPE POUCH INSTRU U-SHP 10X18 (DRAPES) ×3 IMPLANT
DRAPE SURG 17X23 STRL (DRAPES) ×9 IMPLANT
DRILL BIT SKYLINE 14MM (BIT) ×2
DRILL NEURO 2X3.1 SOFT TOUCH (MISCELLANEOUS) ×3
DURAPREP 26ML APPLICATOR (WOUND CARE) ×3 IMPLANT
ELECT COATED BLADE 2.86 ST (ELECTRODE) ×3 IMPLANT
ELECT REM PT RETURN 9FT ADLT (ELECTROSURGICAL) ×3
ELECTRODE REM PT RTRN 9FT ADLT (ELECTROSURGICAL) ×1 IMPLANT
EVACUATOR SILICONE 100CC (DRAIN) ×3 IMPLANT
GAUZE 4X4 16PLY RFD (DISPOSABLE) ×3 IMPLANT
GAUZE SPONGE 4X4 12PLY STRL (GAUZE/BANDAGES/DRESSINGS) ×3 IMPLANT
GAUZE SPONGE 4X4 12PLY STRL LF (GAUZE/BANDAGES/DRESSINGS) ×2 IMPLANT
GLOVE BIO SURGEON STRL SZ7 (GLOVE) ×3 IMPLANT
GLOVE BIO SURGEON STRL SZ8 (GLOVE) ×3 IMPLANT
GLOVE BIOGEL PI IND STRL 8 (GLOVE) ×1 IMPLANT
GLOVE BIOGEL PI INDICATOR 8 (GLOVE) ×2
GOWN STRL REUS W/ TWL LRG LVL3 (GOWN DISPOSABLE) ×1 IMPLANT
GOWN STRL REUS W/ TWL XL LVL3 (GOWN DISPOSABLE) ×1 IMPLANT
GOWN STRL REUS W/TWL LRG LVL3 (GOWN DISPOSABLE) ×2
GOWN STRL REUS W/TWL XL LVL3 (GOWN DISPOSABLE) ×2
GRAFT BNE MATRIX VG FRMBL SM 1 (Bone Implant) IMPLANT
INTERLOCK LRDTC CRVCL VBR 6MM (Bone Implant) IMPLANT
INTERLOCK LRDTC CRVCL VBR 7MM (Bone Implant) IMPLANT
IV CATH 14GX2 1/4 (CATHETERS) ×3 IMPLANT
KIT BASIN OR (CUSTOM PROCEDURE TRAY) ×3 IMPLANT
KIT TURNOVER KIT B (KITS) ×3 IMPLANT
LORDOTIC CERVICAL VBR 6MM SM (Bone Implant) ×6 IMPLANT
LORDOTIC CERVICAL VBR 7MM SM (Bone Implant) ×3 IMPLANT
MANIFOLD NEPTUNE II (INSTRUMENTS) ×3 IMPLANT
NDL PRECISIONGLIDE 27X1.5 (NEEDLE) ×1 IMPLANT
NDL SPNL 20GX3.5 QUINCKE YW (NEEDLE) ×1 IMPLANT
NEEDLE PRECISIONGLIDE 27X1.5 (NEEDLE) ×3 IMPLANT
NEEDLE SPNL 20GX3.5 QUINCKE YW (NEEDLE) ×3 IMPLANT
NS IRRIG 1000ML POUR BTL (IV SOLUTION) ×3 IMPLANT
OIL CARTRIDGE MAESTRO DRILL (MISCELLANEOUS) ×3
PACK ORTHO CERVICAL (CUSTOM PROCEDURE TRAY) ×3 IMPLANT
PAD ARMBOARD 7.5X6 YLW CONV (MISCELLANEOUS) ×6 IMPLANT
PATTIES SURGICAL .5 X.5 (GAUZE/BANDAGES/DRESSINGS) IMPLANT
PATTIES SURGICAL .5 X1 (DISPOSABLE) IMPLANT
PIN DISTRACTION 14 (PIN) ×4 IMPLANT
PLATE SKYLINE 3LVL 45MM CERV (Plate) ×2 IMPLANT
SCREW SKYLINE VAR OS 14MM (Screw) ×16 IMPLANT
SPONGE INTESTINAL PEANUT (DISPOSABLE) ×3 IMPLANT
SPONGE SURGIFOAM ABS GEL 100 (HEMOSTASIS) ×3 IMPLANT
STRIP CLOSURE SKIN 1/2X4 (GAUZE/BANDAGES/DRESSINGS) ×2 IMPLANT
SURGIFLO W/THROMBIN 8M KIT (HEMOSTASIS) IMPLANT
SUT ETHILON 3 0 FSL (SUTURE) IMPLANT
SUT MNCRL AB 4-0 PS2 18 (SUTURE) IMPLANT
SUT SILK 2 0 TIES 17X18 (SUTURE) ×2
SUT SILK 2-0 18XBRD TIE BLK (SUTURE) ×1 IMPLANT
SUT VIC AB 2-0 CT2 18 VCP726D (SUTURE) ×3 IMPLANT
SYR BULB IRRIGATION 50ML (SYRINGE) ×3 IMPLANT
SYR CONTROL 10ML LL (SYRINGE) ×3 IMPLANT
TAPE CLOTH 4X10 WHT NS (GAUZE/BANDAGES/DRESSINGS) ×3 IMPLANT
TAPE CLOTH SURG 4X10 WHT LF (GAUZE/BANDAGES/DRESSINGS) ×2 IMPLANT
TAPE UMBILICAL COTTON 1/8X30 (MISCELLANEOUS) ×3 IMPLANT
TOWEL OR 17X24 6PK STRL BLUE (TOWEL DISPOSABLE) ×3 IMPLANT
TOWEL OR 17X26 10 PK STRL BLUE (TOWEL DISPOSABLE) ×3 IMPLANT
TRAY FOLEY MTR SLVR 16FR STAT (SET/KITS/TRAYS/PACK) ×3 IMPLANT
WATER STERILE IRR 1000ML POUR (IV SOLUTION) ×3 IMPLANT
YANKAUER SUCT BULB TIP NO VENT (SUCTIONS) ×3 IMPLANT

## 2018-08-26 NOTE — Anesthesia Procedure Notes (Signed)
Procedure Name: Intubation Date/Time: 08/26/2018 12:43 PM Performed by: Rogelia Boga, CRNA Pre-anesthesia Checklist: Patient identified, Emergency Drugs available, Suction available, Patient being monitored and Timeout performed Patient Re-evaluated:Patient Re-evaluated prior to induction Oxygen Delivery Method: Circle system utilized Preoxygenation: Pre-oxygenation with 100% oxygen Induction Type: IV induction Ventilation: Mask ventilation without difficulty and Oral airway inserted - appropriate to patient size Laryngoscope Size: Glidescope and 4 Grade View: Grade I Tube type: Oral Tube size: 7.5 mm Number of attempts: 1 Airway Equipment and Method: Stylet and Video-laryngoscopy Placement Confirmation: ETT inserted through vocal cords under direct vision,  positive ETCO2 and breath sounds checked- equal and bilateral Secured at: 22 cm Tube secured with: Tape Dental Injury: Teeth and Oropharynx as per pre-operative assessment

## 2018-08-26 NOTE — Transfer of Care (Signed)
Immediate Anesthesia Transfer of Care Note  Patient: Julie Hess  Procedure(s) Performed: ANTERIOR CERVICAL DECOMPRESSION FUSION, CERVICAL 4-5, CERVICAL 5-6, CERVICAL 6-7 WITH INSTRUMENTATION AND ALLOGRAFT (N/A )  Patient Location: PACU  Anesthesia Type:General  Level of Consciousness: awake, alert, oriented   Airway & Oxygen Therapy: Patient Spontanous Breathing and Patient connected to face mask oxygen  Post-op Assessment: Report given to RN and Post -op Vital signs reviewed and stable  Post vital signs: Reviewed and stable  Last Vitals:  Vitals Value Taken Time  BP 121/63 08/26/2018  4:37 PM  Temp    Pulse 90 08/26/2018  4:42 PM  Resp 17 08/26/2018  4:42 PM  SpO2 96 % 08/26/2018  4:42 PM  Vitals shown include unvalidated device data.  Last Pain:  Vitals:   08/26/18 1639  TempSrc:   PainSc: (P) 0-No pain      Patients Stated Pain Goal: 3 (08/26/18 1143)  Complications: No apparent anesthesia complications

## 2018-08-26 NOTE — Anesthesia Procedure Notes (Signed)
Procedure Name: Intubation Date/Time: 08/26/2018 1:13 PM Performed by: Rogelia Boga, CRNA Pre-anesthesia Checklist: Patient identified, Emergency Drugs available, Suction available, Patient being monitored and Timeout performed Patient Re-evaluated:Patient Re-evaluated prior to induction Oxygen Delivery Method: Circle system utilized Preoxygenation: Pre-oxygenation with 100% oxygen Induction Type: Inhalational induction with existing ETT Laryngoscope Size: Glidescope and 4 Grade View: Grade I Tube type: Parker flex tip Tube size: 7.5 mm Number of attempts: 1 Airway Equipment and Method: Video-laryngoscopy and Bougie stylet Placement Confirmation: ETT inserted through vocal cords under direct vision,  positive ETCO2 and breath sounds checked- equal and bilateral Secured at: 23 cm Tube secured with: Tape Dental Injury: Teeth and Oropharynx as per pre-operative assessment  Difficulty Due To: Difficulty was anticipated and Difficult Airway-  due to neck instability Comments: Cuff leaking on existing ETT.  ETT changed over bougie, + ETC02, BBS equal, VSS

## 2018-08-26 NOTE — H&P (Signed)
PREOPERATIVE H&P  Chief Complaint: Right arm pain  HPI: Julie Hess is a 48 y.o. female who presents with ongoing pain in the right arm  MRI reveals cervical spinal stenosis spanning C4-C7  Patient has failed multiple forms of conservative care and continues to have pain (see office notes for additional details regarding the patient's full course of treatment)  Past Medical History:  Diagnosis Date  . Anxiety   . Complication of anesthesia    Hard time waking up after C-Section 1997, but tolerated anesthesia since then no issues  . Dyspnea    with activity  . GERD (gastroesophageal reflux disease)   . Hyperlipidemia   . Hypertension   . Hypothyroidism   . Migraines    migraines  . Sleep apnea   . Thyroid disease   . Ulcerative colitis, chronic (HCC)    Past Surgical History:  Procedure Laterality Date  . ABDOMINAL HYSTERECTOMY  2002  . APPENDECTOMY    . BACK SURGERY  2019   lumbar fusion  . BACK SURGERY     discetomy  . CESAREAN SECTION  1996  . CHOLECYSTECTOMY    . SPINE SURGERY  [id number]   lumbar   Social History   Socioeconomic History  . Marital status: Married    Spouse name: Not on file  . Number of children: Not on file  . Years of education: Not on file  . Highest education level: Not on file  Occupational History  . Not on file  Social Needs  . Financial resource strain: Not on file  . Food insecurity:    Worry: Not on file    Inability: Not on file  . Transportation needs:    Medical: Not on file    Non-medical: Not on file  Tobacco Use  . Smoking status: Former Smoker    Last attempt to quit: 08/11/2004    Years since quitting: 14.0  . Smokeless tobacco: Never Used  Substance and Sexual Activity  . Alcohol use: Yes    Comment: occasional  . Drug use: No  . Sexual activity: Not on file  Lifestyle  . Physical activity:    Days per week: Not on file    Minutes per session: Not on file  . Stress: Not on file  Relationships    . Social connections:    Talks on phone: Not on file    Gets together: Not on file    Attends religious service: Not on file    Active member of club or organization: Not on file    Attends meetings of clubs or organizations: Not on file    Relationship status: Not on file  Other Topics Concern  . Not on file  Social History Narrative  . Not on file   No family history on file. Allergies  Allergen Reactions  . Penicillins Hives, Shortness Of Breath and Other (See Comments)    DID THE REACTION INVOLVE: Swelling of the face/tongue/throat, SOB, or low BP? #  #  #  YES  #  #  #  Sudden or severe rash/hives, skin peeling, or the inside of the mouth or nose? No Did it require medical treatment? No When did it last happen?2016   . Other Other (See Comments)    Anti-depressants   Prior to Admission medications   Medication Sig Start Date End Date Taking? Authorizing Provider  busPIRone (BUSPAR) 10 MG tablet Take 10 mg by mouth 2 (two) times daily.  Yes [provider]  ELDERBERRY PO Take 1 capsule by mouth daily.   Yes [provider]  estradiol (VIVELLE-DOT) 0.05 MG/24HR patch Place 1 patch onto the skin 2 (two) times a week.   Yes [provider]  fluticasone (FLOVENT HFA) 44 MCG/ACT inhaler Inhale 2 puffs into the lungs 2 (two) times daily.   Yes [provider]  levothyroxine (SYNTHROID, LEVOTHROID) 75 MCG tablet Take 75 mcg by mouth daily before breakfast.  07/07/15  Yes [provider]  losartan (COZAAR) 50 MG tablet Take 50 mg by mouth at bedtime.   Yes [provider]  omeprazole (PRILOSEC OTC) 20 MG tablet Take 20 mg by mouth daily.   Yes [provider]  pravastatin (PRAVACHOL) 40 MG tablet Take 40 mg by mouth at bedtime.   Yes [provider]  PROAIR HFA 108 (90 BASE) MCG/ACT inhaler Take 2 puffs by mouth every 4 (four) hours as needed for wheezing or shortness of breath.    Yes [provider]   sulfaSALAzine (AZULFIDINE) 500 MG tablet Take 500 mg by mouth daily.    Yes [provider]  aspirin EC 81 MG tablet Take 81 mg by mouth at bedtime.    [provider]     All other systems have been reviewed and were otherwise negative with the exception of those mentioned in the HPI and as above.  Physical Exam: There were no vitals filed for this visit.  There is no height or weight on file to calculate BMI.  General: Alert, no acute distress Cardiovascular: No pedal edema Respiratory: No cyanosis, no use of accessory musculature Skin: No lesions in the area of chief complaint Neurologic: Sensation intact distally Psychiatric: Patient is competent for consent with normal mood and affect Lymphatic: No axillary or cervical lymphadenopathy  MUSCULOSKELETAL: + spurling's sign on the right  Assessment/Plan: RIGHT ARM PAIN WITH COMPRESSION OF THE SPINAL CORD AND STENOSIS IDENTIFIED AT C4-5, C5-6 AND C6-7 Plan for Procedure(s): ANTERIOR CERVICAL DECOMPRESSION FUSION, CERVICAL 4-5, CERVICAL 5-6, CERVICAL 6-7 WITH INSTRUMENTATION AND ALLOGRAFT   Jackelyn Hoehn, MD 08/26/2018 8:09 AM

## 2018-08-26 NOTE — Anesthesia Preprocedure Evaluation (Addendum)
Anesthesia Evaluation  Patient identified by MRN, date of birth, ID band Patient awake    Reviewed: Allergy & Precautions, NPO status , Patient's Chart, lab work & pertinent test results  History of Anesthesia Complications Negative for: history of anesthetic complications  Airway Mallampati: II  TM Distance: >3 FB Neck ROM: Limited    Dental  (+) Teeth Intact, Dental Advisory Given   Pulmonary shortness of breath, sleep apnea and Continuous Positive Airway Pressure Ventilation , former smoker,    Pulmonary exam normal        Cardiovascular hypertension, Pt. on medications Normal cardiovascular exam Rhythm:Regular Rate:Normal  Pt had stress echo for dyspnea which was positive, however her cardiologist did not feel any further testing was necessary. He cleared patient for surgery and said cath not indicated. Pt can walk up a flight of stairs without having to stop and catch her breath.   Neuro/Psych PSYCHIATRIC DISORDERS Anxiety    GI/Hepatic Neg liver ROS, GERD  Medicated and Controlled,Ulcerative colitis   Endo/Other  Hypothyroidism Morbid obesity  Renal/GU negative Renal ROS  negative genitourinary   Musculoskeletal negative musculoskeletal ROS (+)   Abdominal (+) + obese,   Peds  Hematology negative hematology ROS (+)   Anesthesia Other Findings 48 yo F for C4-7 ACDF - PMH: OSA, anxiety, HTN, GERD, UC, hypothyroid, BMI 50  Reproductive/Obstetrics                          Anesthesia Physical Anesthesia Plan  ASA: III  Anesthesia Plan: General   Post-op Pain Management:    Induction: Intravenous  PONV Risk Score and Plan: 3 and Ondansetron, Dexamethasone, Midazolam and Treatment may vary due to age or medical condition  Airway Management Planned: Oral ETT and Video Laryngoscope Planned  Additional Equipment: None  Intra-op Plan:   Post-operative Plan: Extubation in  OR  Informed Consent: I have reviewed the patients History and Physical, chart, labs and discussed the procedure including the risks, benefits and alternatives for the proposed anesthesia with the patient or authorized representative who has indicated his/her understanding and acceptance.     Dental advisory given  Plan Discussed with: CRNA, Anesthesiologist and Surgeon  Anesthesia Plan Comments: (Discussed extensively with patient the increased risk of cardiac event with her positive stress echo. However, her cardiologist cleared her for surgery and specifically did not recommend heart cath or other further testing. Given that patient is myelopathic and cath not recommended, I feel it is reasonable to proceed.)      Anesthesia Quick Evaluation

## 2018-08-26 NOTE — Anesthesia Postprocedure Evaluation (Signed)
Anesthesia Post Note  Patient: Julie Hess  Procedure(s) Performed: ANTERIOR CERVICAL DECOMPRESSION FUSION, CERVICAL 4-5, CERVICAL 5-6, CERVICAL 6-7 WITH INSTRUMENTATION AND ALLOGRAFT (N/A )     Patient location during evaluation: PACU Anesthesia Type: General Level of consciousness: sedated Pain management: pain level controlled Vital Signs Assessment: post-procedure vital signs reviewed and stable Respiratory status: spontaneous breathing and respiratory function stable Cardiovascular status: stable Postop Assessment: no apparent nausea or vomiting Anesthetic complications: no    Last Vitals:  Vitals:   08/26/18 1722 08/26/18 1740  BP: 116/63   Pulse: 92   Resp: 19   Temp:  (P) 36.5 C  SpO2: 100%     Last Pain:  Vitals:   08/26/18 1658  TempSrc:   PainSc: 7                  Kishon Garriga DANIEL

## 2018-08-26 NOTE — Progress Notes (Signed)
Vancomycin per Pharmacy for surgical prophylaxis  48 YO F s/p spinal surgery, on vancomycin for surgical prophylaxis d/t penicillin allergy. Scr 0.81 on 1/15, est. crcl >100 ml/min. Received pre-op dose 1500 vancomycin at ~ 1200. No drain.  Plan: Vancomycin mg 1250 at midnight. Pharmacy sign off.  Thanks.   Bayard Hugger, PharmD, BCPS, BCPPS Clinical Pharmacist  Pager: [phone]

## 2018-08-26 NOTE — Op Note (Signed)
NAME:  Julie Hess                MEDICAL RECORD NO.:  [id number]  PHYSICIAN:  Estill Bamberg, MD      DATE OF BIRTH:  11/10/1970  DATE OF PROCEDURE:  08/26/2018                              OPERATIVE REPORT   PREOPERATIVE DIAGNOSES: 1. Right-sided cervical radiculopathy. 2. Spinal stenosis spanning C4-C7.  POSTOPERATIVE DIAGNOSES: 1. Right-sided cervical radiculopathy. 2. Spinal stenosis spanning C4-C7.  PROCEDURE: 1. Anterior cervical decompression and fusion C4/5, C5/6, C6/7. 2. Placement of anterior instrumentation, C4-C7. 3. Insertion of interbody device x3 (small Titan intervertebral spacers). 4. Intraoperative use of fluoroscopy. 5. Use of morselized allograft - ViviGen.  SURGEON:  Estill Bamberg, MD  ASSISTANT:  Jason Coop, PA-C.  ANESTHESIA:  General endotracheal anesthesia.  COMPLICATIONS:  None.  DISPOSITION:  Stable.  ESTIMATED BLOOD LOSS:  Minimal.  INDICATIONS FOR SURGERY:  Briefly, Julie Hess is a pleasant 48 year old patient, who did present to me with severe pain in the neck and right arm.  The patient's MRI did reveal the findings noted above.  Given the patient's ongoing rather debilitating pain and lack of improvement with appropriate treatment measures, we did discuss proceeding with the procedure noted above.  The patient was fully aware of the risks and limitations of surgery as outlined in my preoperative note.  OPERATIVE DETAILS:  On 08/26/2018, the patient was brought to surgery and general endotracheal anesthesia was administered.  The patient was placed supine on the hospital bed. The neck was gently extended.  All bony prominences were meticulously padded.  The neck was prepped and draped in the usual sterile fashion.  At this point, I did make a left-sided transverse incision.  The platysma was incised.  A Smith-Robinson approach was used and the anterior spine was identified. A self-retaining retractor was  placed.  I then subperiosteally exposed the vertebral bodies from C4-C7.  Caspar pins were then placed into the C6 and C7 vertebral bodies and distraction was applied.  A thorough and complete C6-7 intervertebral diskectomy was performed.  The posterior longitudinal ligament was identified and entered using a nerve hook.  I then used #1 followed by #2 Kerrison to perform a thorough and complete intervertebral diskectomy.  The spinal canal was thoroughly decompressed, as was the right and left neuroforamen.  The endplates were then prepared and the appropriate-sized intervertebral spacer was then packed with ViviGen and tamped into position in the usual fashion.  The lower Caspar pin was then removed and placed into the C5 vertebral body and once again, distraction was applied across the C5-6 intervertebral space.  I then again performed a thorough and complete diskectomy, thoroughly decompressing the spinal canal and bilateral neuroforamena.  After preparing the endplates, the appropriate-sized intervertebral spacer was packed with ViviGen and tamped into position.  The lower Caspar pin was then removed and placed into the C4 vertebral body and once again, distraction was applied across the C4-5 intervertebral space.  I then again performed a thorough and complete diskectomy, thoroughly decompressing the spinal canal and bilateral neuroforamena.  After preparing the endplates, the appropriate-sized intervertebral spacer was packed with ViviGen and tamped into position.  Of note, the patient's vertebral bodies were noted to be very small. The implants were inserted to the level of the posterior vertebral body, given the small diameter of the vertebral  bodies,  with the C4/5 implant being 1mm beyond the  posterior margin. I did feel that the implant positions were appropriate, and the implants were well-seated. The Caspar pins then were removed and bone wax was placed in their place.  The  appropriate-sized anterior cervical plate was placed over the anterior spine.  12 mm variable angle screws were placed, 2 in each vertebral body from C4-C7 for a total of 8 vertebral body screws.  The screws were then locked to the plate using the Cam locking mechanism.  I was very pleased with the final fluoroscopic images.  The wound was then irrigated.  The wound was then explored for any undue bleeding and there was no bleeding noted. The wound was then closed in layers using 2-0 Vicryl, followed by 4-0 Monocryl.  Benzoin and Steri-Strips were applied, followed by sterile dressing.  All instrument counts were correct at the termination of the procedure.  Of note, Jason CoopKayla McKenzie, PA-C, was my assistant throughout surgery, and did aid in retraction, suctioning, and closure from start to finish.    Estill BambergMark Avanish Cerullo, MD

## 2018-08-27 ENCOUNTER — Other Ambulatory Visit: Payer: Self-pay

## 2018-08-27 DIAGNOSIS — M4802 Spinal stenosis, cervical region: Secondary | ICD-10-CM | POA: Diagnosis not present

## 2018-08-27 MED FILL — Thrombin (Recombinant) For Soln 20000 Unit: CUTANEOUS | Qty: 1 | Status: AC

## 2018-08-27 NOTE — Progress Notes (Signed)
    Patient doing well  Denies arm pain Has been tolerating PO   Physical Exam: Vitals:   08/26/18 2255 08/27/18 0327  BP: (!) 151/89 (!) 148/82  Pulse: 80 78  Resp: 16 18  Temp: (!) 97.5 F (36.4 C) 97.7 F (36.5 C)  SpO2: 98% 96%   Neck very soft and supple Dressing in place NVI  POD #1 s/p C4-7 ACDF, doing well  - encourage ambulation - Percocet for pain, Valium for muscle spasms - likely d/c home today with f/u in 2 weeks

## 2018-08-27 NOTE — Progress Notes (Signed)
Placed pt on a cpap with a pressure 11(home setting). Added H2O to water chamber and placed on a nasal mask.

## 2018-08-27 NOTE — Progress Notes (Signed)
Patient is discharged from room 3C03 at this time. Alert and in stable condition. IV site d/c'[d and instructions read to patient and spouse with understanding verbalized. Left unit via wheelchair with all belongings at side. 

## 2018-08-30 ENCOUNTER — Encounter (HOSPITAL_COMMUNITY): Payer: Self-pay | Admitting: Orthopedic Surgery

## 2018-09-01 NOTE — Discharge Summary (Signed)
Patient ID: Julie Hess MRN: [id number] DOB/AGE: 47/10/1970 47 y.o.  Admit date: 08/26/2018 Discharge date: 08/27/2018  Admission Diagnoses:  Active Problems:   Radiculopathy   Discharge Diagnoses:  Same  Past Medical History:  Diagnosis Date  . Anxiety   . Complication of anesthesia    Hard time waking up after C-Section 1997, but tolerated anesthesia since then no issues  . Dyspnea    with activity  . GERD (gastroesophageal reflux disease)   . Hyperlipidemia   . Hypertension   . Hypothyroidism   . Migraines    migraines  . Sleep apnea   . Thyroid disease   . Ulcerative colitis, chronic (HCC)     Surgeries: Procedure(s): ANTERIOR CERVICAL DECOMPRESSION FUSION, CERVICAL 4-5, CERVICAL 5-6, CERVICAL 6-7 WITH INSTRUMENTATION AND ALLOGRAFT on 08/26/2018   Consultants: None  Discharged Condition: Improved  Hospital Course: Julie Hess is an 48 y.o. female who was admitted 08/26/2018 for operative treatment of radiculopathy. Patient has severe unremitting pain that affects sleep, daily activities, and work/hobbies. After pre-op clearance the patient was taken to the operating room on 08/26/2018 and underwent  Procedure(s): ANTERIOR CERVICAL DECOMPRESSION FUSION, CERVICAL 4-5, CERVICAL 5-6, CERVICAL 6-7 WITH INSTRUMENTATION AND ALLOGRAFT.    Patient was given perioperative antibiotics:  Anti-infectives (From admission, onward)   Start     Dose/Rate Route Frequency Ordered Stop   08/27/18 1000  levofloxacin (LEVAQUIN) tablet 500 mg  Status:  Discontinued     500 mg Oral Daily 08/26/18 1945 08/27/18 1510   08/27/18 0000  vancomycin (VANCOCIN) 1,250 mg in sodium chloride 0.9 % 250 mL IVPB     1,250 mg 166.7 mL/hr over 90 Minutes Intravenous  Once 08/26/18 2002 08/27/18 0307   08/26/18 1300  vancomycin (VANCOCIN) 1,500 mg in sodium chloride 0.9 % 500 mL IVPB     1,500 mg 250 mL/hr over 120 Minutes Intravenous To [facility] 08/25/18 1348 08/26/18 1356         Patient was given sequential compression devices, early ambulation to prevent DVT.  Patient benefited maximally from hospital stay and there were no complications.    Recent vital signs: BP 115/67 (BP Location: Left Arm)   Pulse 70   Temp (!) 97.5 F (36.4 C) (Oral)   Resp 16   Ht 5\' 5"  (1.651 m)   Wt (!) 137.5 kg   SpO2 95%   BMI 50.46 kg/m    Discharge Medications:   Allergies as of 08/27/2018      Reactions   Penicillins Hives, Shortness Of Breath, Other (See Comments)   DID THE REACTION INVOLVE: Swelling of the face/tongue/throat, SOB, or low BP? #  #  #  YES  #  #  #  Sudden or severe rash/hives, skin peeling, or the inside of the mouth or nose? No Did it require medical treatment? No When did it last happen?2016   Other Other (See Comments)   Anti-depressants      Medication List    TAKE these medications   aspirin EC 81 MG tablet Take 81 mg by mouth at bedtime.   busPIRone 10 MG tablet Commonly known as:  BUSPAR Take 10 mg by mouth 2 (two) times daily.   ELDERBERRY PO Take 1 capsule by mouth daily.   estradiol 0.05 MG/24HR patch Commonly known as:  VIVELLE-DOT Place 1 patch onto the skin 2 (two) times a week.   fluticasone 44 MCG/ACT inhaler Commonly known as:  FLOVENT HFA Inhale 2 puffs into the lungs  2 (two) times daily.   levofloxacin 500 MG tablet Commonly known as:  LEVAQUIN Take 500 mg by mouth daily.   levothyroxine 75 MCG tablet Commonly known as:  SYNTHROID, LEVOTHROID Take 75 mcg by mouth daily before breakfast.   losartan 50 MG tablet Commonly known as:  COZAAR Take 50 mg by mouth at bedtime.   omeprazole 20 MG tablet Commonly known as:  PRILOSEC OTC Take 20 mg by mouth daily.   pravastatin 40 MG tablet Commonly known as:  PRAVACHOL Take 40 mg by mouth at bedtime.   PROAIR HFA 108 (90 Base) MCG/ACT inhaler Generic drug:  albuterol Take 2 puffs by mouth every 4 (four) hours as needed for wheezing or shortness of breath.    sulfaSALAzine 500 MG tablet Commonly known as:  AZULFIDINE Take 500 mg by mouth daily.       Diagnostic Studies: Dg Cervical Spine 1 View  Result Date: 08/26/2018 CLINICAL DATA:  Anterior cervical fusion at the C4-5, C5-6 and C6-7 levels. EXAM: DG C-ARM 61-120 MIN; DG CERVICAL SPINE - 1 VIEW COMPARISON:  None. FINDINGS: Five lateral C-arm views of the cervical spine demonstrate minimal anterior spur formation at the C3-4 and C4-5 levels. The inferior levels are not visualized due to overlapping of the patient's shoulders. An anterior metallic localizer is overlying the anterior shoulders on the 1st 3 images. The last 2 images demonstrate interbody and anterior screw and plate fusion 3 inferior levels. The levels and alignment cannot be determined on these images. IMPRESSION: Anterior cervical fusion, as described above. Electronically Signed   By: Beckie Salts M.D.   On: 08/26/2018 16:43   Dg C-arm 1-60 Min  Result Date: 08/26/2018 CLINICAL DATA:  Anterior cervical fusion at the C4-5, C5-6 and C6-7 levels. EXAM: DG C-ARM 61-120 MIN; DG CERVICAL SPINE - 1 VIEW COMPARISON:  None. FINDINGS: Five lateral C-arm views of the cervical spine demonstrate minimal anterior spur formation at the C3-4 and C4-5 levels. The inferior levels are not visualized due to overlapping of the patient's shoulders. An anterior metallic localizer is overlying the anterior shoulders on the 1st 3 images. The last 2 images demonstrate interbody and anterior screw and plate fusion 3 inferior levels. The levels and alignment cannot be determined on these images. IMPRESSION: Anterior cervical fusion, as described above. Electronically Signed   By: Beckie Salts M.D.   On: 08/26/2018 16:43    Disposition:    POD #1 s/p C4-7 ACDF, doing well  - encourage ambulation - Percocet for pain, Valium for muscle spasms -Written scripts for pain signed and in chart -D/C instructions sheet printed and in chart -D/C today  -F/U in  office 2 weeks   Signed: Eilene Ghazi Delayne Sanzo 09/01/2018, 3:04 PM

## 2018-10-26 DIAGNOSIS — R9439 Abnormal result of other cardiovascular function study: Secondary | ICD-10-CM | POA: Insufficient documentation

## 2019-03-01 ENCOUNTER — Other Ambulatory Visit: Payer: Self-pay | Admitting: Orthopedic Surgery

## 2019-03-28 ENCOUNTER — Other Ambulatory Visit: Payer: Self-pay

## 2019-03-28 ENCOUNTER — Other Ambulatory Visit (HOSPITAL_COMMUNITY)
Admission: RE | Admit: 2019-03-28 | Discharge: 2019-03-28 | Disposition: A | Discharge status: Home / Self Care | Payer: No Typology Code available for payment source | Source: Ambulatory Visit | Attending: Orthopedic Surgery | Admitting: Orthopedic Surgery

## 2019-03-28 ENCOUNTER — Encounter (HOSPITAL_COMMUNITY)
Admission: RE | Admit: 2019-03-28 | Discharge: 2019-03-28 | Disposition: A | Discharge status: Home / Self Care | Payer: No Typology Code available for payment source | Source: Ambulatory Visit | Attending: Orthopedic Surgery | Admitting: Orthopedic Surgery

## 2019-03-28 ENCOUNTER — Encounter (HOSPITAL_COMMUNITY): Payer: Self-pay

## 2019-03-28 DIAGNOSIS — Z20828 Contact with and (suspected) exposure to other viral communicable diseases: Secondary | ICD-10-CM | POA: Insufficient documentation

## 2019-03-28 DIAGNOSIS — Z01812 Encounter for preprocedural laboratory examination: Secondary | ICD-10-CM | POA: Diagnosis present

## 2019-03-28 HISTORY — DX: Anemia, unspecified: D64.9

## 2019-03-28 HISTORY — DX: Unspecified asthma, uncomplicated: J45.909

## 2019-03-28 LAB — URINALYSIS, ROUTINE W REFLEX MICROSCOPIC
Bilirubin Urine: NEGATIVE
Glucose, UA: NEGATIVE mg/dL
Hgb urine dipstick: NEGATIVE
Ketones, ur: NEGATIVE mg/dL
Leukocytes,Ua: NEGATIVE
Nitrite: NEGATIVE
Protein, ur: NEGATIVE mg/dL
Specific Gravity, Urine: 1.013 (ref 1.005–1.030)
pH: 5 (ref 5.0–8.0)

## 2019-03-28 LAB — CBC WITH DIFFERENTIAL/PLATELET
Abs Immature Granulocytes: 0.09 10*3/uL — ABNORMAL HIGH (ref 0.00–0.07)
Basophils Absolute: 0.1 10*3/uL (ref 0.0–0.1)
Basophils Relative: 1 %
Eosinophils Absolute: 0.2 10*3/uL (ref 0.0–0.5)
Eosinophils Relative: 2 %
HCT: 44.2 % (ref 36.0–46.0)
Hemoglobin: 13.8 g/dL (ref 12.0–15.0)
Immature Granulocytes: 1 %
Lymphocytes Relative: 33 %
Lymphs Abs: 2.9 10*3/uL (ref 0.7–4.0)
MCH: 28.5 pg (ref 26.0–34.0)
MCHC: 31.2 g/dL (ref 30.0–36.0)
MCV: 91.1 fL (ref 80.0–100.0)
Monocytes Absolute: 0.5 10*3/uL (ref 0.1–1.0)
Monocytes Relative: 6 %
Neutro Abs: 5.1 10*3/uL (ref 1.7–7.7)
Neutrophils Relative %: 57 %
Platelets: 304 10*3/uL (ref 150–400)
RBC: 4.85 MIL/uL (ref 3.87–5.11)
RDW: 14.3 % (ref 11.5–15.5)
WBC: 8.9 10*3/uL (ref 4.0–10.5)
nRBC: 0 % (ref 0.0–0.2)

## 2019-03-28 LAB — COMPREHENSIVE METABOLIC PANEL
ALT: 31 U/L (ref 0–44)
AST: 26 U/L (ref 15–41)
Albumin: 3.9 g/dL (ref 3.5–5.0)
Alkaline Phosphatase: 123 U/L (ref 38–126)
Anion gap: 12 (ref 5–15)
BUN: 12 mg/dL (ref 6–20)
CO2: 21 mmol/L — ABNORMAL LOW (ref 22–32)
Calcium: 9.4 mg/dL (ref 8.9–10.3)
Chloride: 105 mmol/L (ref 98–111)
Creatinine, Ser: 0.84 mg/dL (ref 0.44–1.00)
GFR calc Af Amer: >60 mL/min (ref >60)
GFR calc non Af Amer: >60 mL/min (ref >60)
Glucose, Bld: 96 mg/dL (ref 70–99)
Potassium: 4.2 mmol/L (ref 3.5–5.1)
Sodium: 138 mmol/L (ref 135–145)
Total Bilirubin: 0.6 mg/dL (ref 0.3–1.2)
Total Protein: 7.1 g/dL (ref 6.5–8.1)

## 2019-03-28 LAB — PROTIME-INR
INR: 1 (ref 0.8–1.2)
Prothrombin Time: 13.4 seconds (ref 11.4–15.2)

## 2019-03-28 LAB — SARS CORONAVIRUS 2 (TAT 6-24 HRS): SARS Coronavirus 2: NEGATIVE

## 2019-03-28 LAB — SURGICAL PCR SCREEN
MRSA, PCR: NEGATIVE
Staphylococcus aureus: NEGATIVE

## 2019-03-28 LAB — TYPE AND SCREEN
ABO/RH(D): A POS
Antibody Screen: NEGATIVE

## 2019-03-28 LAB — APTT: aPTT: 29 seconds (ref 24–36)

## 2019-03-28 NOTE — Pre-Procedure Instructions (Addendum)
Julie Hess  03/28/2019    Your procedure is scheduled on Wednesday, March 30, 2019 at 8:30 AM.   Report to [facility] Entrance "A" Admitting Office at 6:30 AM.   Call this number if you have problems the morning of surgery: [phone]   Questions prior to day of surgery, please call [phone] between 8 & 4 PM.   Remember:  Do not eat or drink after midnight Tuesday, 03/29/19.  You may drink clear liquids until 5:30 AM .  Clear liquids allowed are: Water, Gatorade, Black Coffee, clear tea, carbonated beverages, clear juice (no-pulp), plain Jello, plain Popsicles      Drink PreSurgery Ensure at 5:30 AM day of surgery.                  Take these medicines the morning of surgery with A SIP OF WATER: Buspirone (Buspar), Gabapentin (Neurontin), Levothyroxine (Synthroid), Pantoprazole (Protonix), Methocarbamol (Robaxin) - if needed  Stop Aspirin as instructed by surgeon/physician. Stop NSAIDS (Mobic, Meloxicam, Ibuprofen, Aleve, etc) as of today prior to surgery. Do not use Herbal medications, other Aspirin products (BC Powders, Goody's, etc) or Multivitamins prior to surgery    Do not wear jewelry, make-up or nail polish.  Do not wear lotions, powders, perfumes or deodorant.  Do not shave 48 hours prior to surgery.    Do not bring valuables to the hospital.  [facility] is not responsible for any belongings or valuables.  Contacts, dentures or bridgework may not be worn into surgery.  Leave your suitcase in the car.  After surgery it may be brought to your room.  For patients admitted to the hospital, discharge time will be determined by your treatment team.  [facility] - Preparing for Surgery  Before surgery, you can play an important role.  Because skin is not sterile, your skin needs to be as free of germs as possible.  You can reduce the number of germs on you skin by washing with CHG (chlorahexidine gluconate) soap before surgery.  CHG is an antiseptic cleaner  which kills germs and bonds with the skin to continue killing germs even after washing.  Oral Hygiene is also important in reducing the risk of infection.  Remember to brush your teeth with your regular toothpaste the morning of surgery.  Please DO NOT use if you have an allergy to CHG or antibacterial soaps.  If your skin becomes reddened/irritated stop using the CHG and inform your nurse when you arrive at Short Stay.  Do not shave (including legs and underarms) for at least 48 hours prior to the first CHG shower.  You may shave your face.  Please follow these instructions carefully:   1.  Shower with CHG Soap the night before surgery and the morning of Surgery.  2.  If you choose to wash your hair, wash your hair first as usual with your normal shampoo.  3.  After you shampoo, rinse your hair and body thoroughly to remove the shampoo. 4.  Use CHG as you would any other liquid soap.  You can apply chg directly to the skin and wash gently with a      scrungie or washcloth.           5.  Apply the CHG Soap to your body ONLY FROM THE NECK DOWN.   Do not use on open wounds or open sores. Avoid contact with your eyes, ears, mouth and genitals (private parts).  Wash genitals (private parts) with your  normal soap - do this prior to using CHG soap.  6.  Wash thoroughly, paying special attention to the area where your surgery will be performed.  7.  Thoroughly rinse your body with warm water from the neck down.  8.  DO NOT shower/wash with your normal soap after using and rinsing off the CHG Soap.  9.  Pat yourself dry with a clean towel.            10.  Wear clean pajamas.            11.  Place clean sheets on your bed the night of your first shower and do not sleep with pets.  Day of Surgery  Shower as above. Do not apply any lotions/deodorants the morning of surgery.   Please wear clean clothes to the hospital. Remember to brush your teeth with toothpaste.   Please read over the fact sheets  that you were given.

## 2019-03-28 NOTE — Progress Notes (Addendum)
PCP - [name], PA Cardiologist - Dr. Idolina Primer  Chest x-ray - N/a EKG - 08/26/18 Stress Test -  ECHO - (stress echo), 07/13/18 Cardiac Cath - denies CT coronary - 12/2018  Sleep Study - 02/05/17 (Care Everywhere) CPAP - yes  Fasting Blood Sugar - n/a Checks Blood Sugar _____ times a day  Blood Thinner Instructions:n/a Aspirin Instructions: last dose 03/22/19  Anesthesia review: yes, had workup by cardiologist in June, 2020 for lower extremity edema and sleep apnea  Patient denies shortness of breath, fever, cough and chest pain at PAT appointment   Patient verbalized understanding of instructions that were given to them at the PAT appointment. Patient was also instructed that they will need to review over the PAT instructions again at home before surgery.  Pt going to get Covid test done today. Given instructions about quarantine and she voiced understanding.   Coronavirus Screening  Have you experienced the following symptoms:  Cough No Fever (>100.49F)  yes/no: No Runny nose yes/no: No Sore throat yes/no: No Difficulty breathing/shortness of breath  yes/no: No  Have you or a family member traveled in the last 14 days and where? yes/no: No  Patient reminded that hospital visitation restrictions are in effect and the importance of the restrictions. Pt informed that she may have 1 visitor wait in the waiting room while she is in pre-op, surgery and PACU. That 1 visitor may visit her once she is admitted during visiting hours.

## 2019-03-29 MED ORDER — VANCOMYCIN HCL 10 G IV SOLR
1500.0000 mg | INTRAVENOUS | Status: AC
  Administered 2019-03-30: 08:00:00 1500 mg via INTRAVENOUS
  Filled 2019-03-29 (×2): qty 1500

## 2019-03-29 NOTE — Progress Notes (Signed)
Anesthesia Chart Review:  Case: [id number] Date/Time: 03/30/19 0815   Procedure: LEFT LATERAL LUMBAR 3-4 INTERBODY FUSION WITH INSTRUMENTATION AND ALLOGRAFT (Left )   Anesthesia type: General   Pre-op diagnosis: RIGHT LUMBAR 3 RADICULOPATHY   [facility] ROOM [facility] / [facility] OR   Surgeon: Estill Bambergumonski, Mark, MD      DISCUSSION: Patient is a 48 year old female scheduled for the above procedure.  History includes former smoker (quit '06), ulcerative colitis, exertional dyspnea, asthma ("mild") HTN, OSA (CPAP), hypothyroidism, GERD, HLD, hysterectomy 2002, L4-S1 fusion, C4-7 ACDF 08/26/18.  BMI is consistent with morbid obesity.  She had an abnormal stress echo ("ef 50-55; 7 mets, 101% mphr; HK at peak in septum/anteroseptum") 07/2018 [facility], but no additional testing recommended at that time per Dr. Boneta Luckshinehart. Since then she underwent ACDF 1/20210 and had a CTA coronary in 12/2018 that showed a calcium score of 1, EF 61%, no stenosis noted within the coronary arteries; however, only proximal large vessels were visualized so could not rule out distal disease. She was re-evaluated by cardiologist Dr. Chestine Sporelark on 02/08/19 for follow-up edema. He felt BNP and coronary CTA were reassuring, and no additional testing ordered. Patient walking ~ 1 miles each morning at that time, although sometimes limited by low back pain. No chest pain. Follow-up in 6-9 months planned.  03/28/2019 presurgical COVID test negative.  If no acute changes and I would anticipate that she can proceed as planned.   VS: BP 125/73   Pulse 83   Temp (!) 36.4 C   Resp 20   Ht 5\' 5"  (1.651 m)   Wt (!) 138 kg   SpO2 100%   BMI 50.62 kg/m    PROVIDERS: Rolan Bucco- Compton, Kimberly D., PA-C is PCP - [facility] is [facility]. Last visit 02/08/19 with Alvin Critchleylark, Bradley, MD. Previously seen by Chapman Fitchinehart, Kenneth, MD. Details in DISCUSSION.  - [facility] for OSA.   LABS: Labs reviewed:  Acceptable for surgery. (all labs ordered are listed, but only abnormal results are displayed)  Labs Reviewed  CBC WITH DIFFERENTIAL/PLATELET - Abnormal; Notable for the following components:      Result Value   Abs Immature Granulocytes 0.09 (*)    All other components within normal limits  COMPREHENSIVE METABOLIC PANEL - Abnormal; Notable for the following components:   CO2 21 (*)    All other components within normal limits  SURGICAL PCR SCREEN  APTT  PROTIME-INR  URINALYSIS, ROUTINE W REFLEX MICROSCOPIC  TYPE AND SCREEN     IMAGES: MRI L-spine 12/08/18 ([facility]): IMPRESSION: - L4-S1 fusion. Fluid collection within the laminectomy bed overlying L4/L5 likely postoperative seroma. Sterility strictly indeterminate. - Mild degenerative changes of the lumbar spine not significant change. Most notably: - L3/L4 mild central canal mild bilateral foraminal stenosis. - L1/L2 and minimal central canal and right foraminal stenosis.  CXR 06/24/18 [facility]) FINDINGS:  No acute infiltrate, pleural effusion or pneumothorax. The cardiomediastinal silhouette is normal in size and contour. IMPRESSION: No acute cardiopulmonary disease.    EKG: 08/26/18: Normal sinus rhythm Low voltage QRS Borderline ECG No previous tracing Confirmed by Chilton Siandolph, Tiffany ([id number]) on 08/26/2018 10:02:59 PM   CV: CTA Coronary w/ calcium scoring 12/27/18 ([facility]); IMPRESSION: - Calcium score of 1  - Normal left ventricular function with EF of 61% - Technically difficult study. No plaque or stenosis visualized within the coronary arteries. Unfortunately, only the proximal large vessels  were well visualized, and cannot rule out distal vessel disease.  - Visible lung fields are clear. No mediastinal adenopathy or significant  abnormality in the visible abdomen.  Stress Echo 07/13/18 [facility]): According to Dr. Ayesha Rumpf 07/27/18 note, "Stress echocardiogram 07/2018.  [facility] treadmill score +5. Normal LV EF 50-55%. Abnormal stress echo. Moderate size post exercise wall motion abnormality in the anteroseptal wall suggestive of myocardial ischemia.   Past Medical History:  Diagnosis Date  . Anemia    with ulcerative colitis  . Anxiety   . Asthma    very mild per pt  . Complication of anesthesia    Hard time waking up after C-Section 1997, but tolerated anesthesia since then no issues  . Dyspnea    with activity  . GERD (gastroesophageal reflux disease)   . Hyperlipidemia   . Hypertension   . Hypothyroidism   . Migraines    migraines  . Sleep apnea    uses cpap  . Thyroid disease   . Ulcerative colitis, chronic ([place])     Past Surgical History:  Procedure Laterality Date  . ABDOMINAL HYSTERECTOMY  2002  . ANTERIOR CERVICAL DECOMPRESSION/DISCECTOMY FUSION 4 LEVEL/HARDWARE REMOVAL N/A 08/26/2018   Procedure: ANTERIOR CERVICAL DECOMPRESSION FUSION, CERVICAL 4-5, CERVICAL 5-6, CERVICAL 6-7 WITH INSTRUMENTATION AND ALLOGRAFT;  Surgeon: Phylliss Bob, MD;  Location: [place];  Service: Orthopedics;  Laterality: N/A;  . APPENDECTOMY    . BACK SURGERY  2019   lumbar fusion  . BACK SURGERY     discetomy  . CESAREAN SECTION  1996  . CHOLECYSTECTOMY    . SPINE SURGERY  [id number]   lumbar    MEDICATIONS: . aspirin EC 81 MG tablet  . busPIRone (BUSPAR) 10 MG tablet  . estradiol (VIVELLE-DOT) 0.05 MG/24HR patch  . gabapentin (NEURONTIN) 600 MG tablet  . levothyroxine (SYNTHROID, LEVOTHROID) 75 MCG tablet  . losartan (COZAAR) 50 MG tablet  . meloxicam (MOBIC) 15 MG tablet  . methocarbamol (ROBAXIN) 500 MG tablet  . pantoprazole (PROTONIX) 40 MG tablet  . pravastatin (PRAVACHOL) 40 MG tablet  . vitamin C (ASCORBIC ACID) 500 MG tablet   No current facility-administered medications for this encounter.    Derrill Memo ON 03/30/2019] vancomycin (VANCOCIN) 1,500 mg in sodium chloride 0.9 % 500 mL IVPB  Last ASA 03/22/19.   Myra Gianotti, PA-C Surgical  Short Stay/Anesthesiology [facility] Phone [phone] [facility] Phone [phone] 03/29/2019 10:24 AM

## 2019-03-29 NOTE — Anesthesia Preprocedure Evaluation (Addendum)
Anesthesia Evaluation  Patient identified by MRN, date of birth, ID band Patient awake    Reviewed: Allergy & Precautions, NPO status , Patient's Chart, lab work & pertinent test results  Airway Mallampati: II  TM Distance: >3 FB Neck ROM: Full    Dental no notable dental hx.    Pulmonary asthma , sleep apnea and Continuous Positive Airway Pressure Ventilation , former smoker,    Pulmonary exam normal breath sounds clear to auscultation       Cardiovascular hypertension, Pt. on medications Normal cardiovascular exam Rhythm:Regular Rate:Normal  ECG: NSR, rate 82   Neuro/Psych  Headaches, Anxiety  Neuromuscular disease    GI/Hepatic Neg liver ROS, PUD, GERD  Medicated and Controlled,  Endo/Other  Hypothyroidism Morbid obesity  Renal/GU negative Renal ROS     Musculoskeletal negative musculoskeletal ROS (+)   Abdominal   Peds  Hematology HLD   Anesthesia Other Findings RIGHT LUMBAR 3 RADICULOPATHY  Reproductive/Obstetrics S/p hysterectomy                          Anesthesia Physical Anesthesia Plan  ASA: IV  Anesthesia Plan: General   Post-op Pain Management:    Induction: Intravenous  PONV Risk Score and Plan: 4 or greater and Midazolam, Ondansetron and Treatment may vary due to age or medical condition  Airway Management Planned: Oral ETT and Video Laryngoscope Planned  Additional Equipment:   Intra-op Plan:   Post-operative Plan: Extubation in OR  Informed Consent: I have reviewed the patients History and Physical, chart, labs and discussed the procedure including the risks, benefits and alternatives for the proposed anesthesia with the patient or authorized representative who has indicated his/her understanding and acceptance.     Dental advisory given  Plan Discussed with: CRNA  Anesthesia Plan Comments: (Reviewed PAT note written 03/29/2019 by Myra Gianotti, PA-C. )      Anesthesia Quick Evaluation

## 2019-03-30 ENCOUNTER — Inpatient Hospital Stay (HOSPITAL_COMMUNITY)
Admission: RE | Admit: 2019-03-30 | Discharge: 2019-04-01 | DRG: 455 | Disposition: A | Discharge status: Home / Self Care | Payer: No Typology Code available for payment source | Attending: Orthopedic Surgery | Admitting: Orthopedic Surgery

## 2019-03-30 ENCOUNTER — Inpatient Hospital Stay (HOSPITAL_COMMUNITY): Payer: No Typology Code available for payment source | Admitting: Vascular Surgery

## 2019-03-30 ENCOUNTER — Other Ambulatory Visit: Payer: Self-pay

## 2019-03-30 ENCOUNTER — Encounter (HOSPITAL_COMMUNITY): Payer: Self-pay | Admitting: *Deleted

## 2019-03-30 ENCOUNTER — Encounter (HOSPITAL_COMMUNITY)
Admission: RE | Disposition: A | Discharge status: Home / Self Care | Payer: Self-pay | Source: Home / Self Care | Attending: Orthopedic Surgery

## 2019-03-30 ENCOUNTER — Inpatient Hospital Stay (HOSPITAL_COMMUNITY): Payer: No Typology Code available for payment source

## 2019-03-30 ENCOUNTER — Inpatient Hospital Stay (HOSPITAL_COMMUNITY): Payer: No Typology Code available for payment source | Admitting: Anesthesiology

## 2019-03-30 DIAGNOSIS — Z880 Allergy status to penicillin: Secondary | ICD-10-CM

## 2019-03-30 DIAGNOSIS — M48061 Spinal stenosis, lumbar region without neurogenic claudication: Principal | ICD-10-CM | POA: Diagnosis present

## 2019-03-30 DIAGNOSIS — Z981 Arthrodesis status: Secondary | ICD-10-CM

## 2019-03-30 DIAGNOSIS — J45909 Unspecified asthma, uncomplicated: Secondary | ICD-10-CM | POA: Diagnosis present

## 2019-03-30 DIAGNOSIS — Z87891 Personal history of nicotine dependence: Secondary | ICD-10-CM | POA: Diagnosis not present

## 2019-03-30 DIAGNOSIS — Z7982 Long term (current) use of aspirin: Secondary | ICD-10-CM

## 2019-03-30 DIAGNOSIS — E039 Hypothyroidism, unspecified: Secondary | ICD-10-CM | POA: Diagnosis present

## 2019-03-30 DIAGNOSIS — F419 Anxiety disorder, unspecified: Secondary | ICD-10-CM | POA: Diagnosis present

## 2019-03-30 DIAGNOSIS — Z79890 Hormone replacement therapy (postmenopausal): Secondary | ICD-10-CM | POA: Diagnosis not present

## 2019-03-30 DIAGNOSIS — E785 Hyperlipidemia, unspecified: Secondary | ICD-10-CM | POA: Diagnosis present

## 2019-03-30 DIAGNOSIS — M5416 Radiculopathy, lumbar region: Secondary | ICD-10-CM | POA: Diagnosis present

## 2019-03-30 DIAGNOSIS — I10 Essential (primary) hypertension: Secondary | ICD-10-CM | POA: Diagnosis present

## 2019-03-30 DIAGNOSIS — Z888 Allergy status to other drugs, medicaments and biological substances status: Secondary | ICD-10-CM

## 2019-03-30 DIAGNOSIS — K219 Gastro-esophageal reflux disease without esophagitis: Secondary | ICD-10-CM | POA: Diagnosis present

## 2019-03-30 DIAGNOSIS — Z79899 Other long term (current) drug therapy: Secondary | ICD-10-CM

## 2019-03-30 DIAGNOSIS — G4730 Sleep apnea, unspecified: Secondary | ICD-10-CM | POA: Diagnosis present

## 2019-03-30 DIAGNOSIS — Z791 Long term (current) use of non-steroidal anti-inflammatories (NSAID): Secondary | ICD-10-CM

## 2019-03-30 DIAGNOSIS — M5410 Radiculopathy, site unspecified: Secondary | ICD-10-CM | POA: Diagnosis present

## 2019-03-30 DIAGNOSIS — Z90710 Acquired absence of both cervix and uterus: Secondary | ICD-10-CM | POA: Diagnosis not present

## 2019-03-30 DIAGNOSIS — Z885 Allergy status to narcotic agent status: Secondary | ICD-10-CM

## 2019-03-30 DIAGNOSIS — Z419 Encounter for procedure for purposes other than remedying health state, unspecified: Secondary | ICD-10-CM

## 2019-03-30 DIAGNOSIS — Z9049 Acquired absence of other specified parts of digestive tract: Secondary | ICD-10-CM

## 2019-03-30 HISTORY — PX: ANTERIOR LAT LUMBAR FUSION: SHX1168

## 2019-03-30 SURGERY — ANTERIOR LATERAL LUMBAR FUSION 1 LEVEL
Anesthesia: General | Site: Spine Lumbar | Laterality: Left

## 2019-03-30 MED ORDER — LACTATED RINGERS IV SOLN
INTRAVENOUS | Status: DC
  Administered 2019-03-30: 08:00:00 via INTRAVENOUS

## 2019-03-30 MED ORDER — DEXAMETHASONE SODIUM PHOSPHATE 10 MG/ML IJ SOLN
INTRAMUSCULAR | Status: DC | PRN
  Administered 2019-03-30: 10 mg via INTRAVENOUS

## 2019-03-30 MED ORDER — MIDAZOLAM HCL 2 MG/2ML IJ SOLN
INTRAMUSCULAR | Status: AC
  Filled 2019-03-30: qty 2

## 2019-03-30 MED ORDER — PRAVASTATIN SODIUM 40 MG PO TABS
40.0000 mg | ORAL_TABLET | Freq: Every day | ORAL | Status: DC
  Administered 2019-03-30 – 2019-03-31 (×2): 40 mg via ORAL
  Filled 2019-03-30 (×2): qty 1

## 2019-03-30 MED ORDER — FENTANYL CITRATE (PF) 250 MCG/5ML IJ SOLN
INTRAMUSCULAR | Status: AC
  Filled 2019-03-30: qty 5

## 2019-03-30 MED ORDER — BUSPIRONE HCL 10 MG PO TABS
10.0000 mg | ORAL_TABLET | Freq: Two times a day (BID) | ORAL | Status: DC
  Administered 2019-03-30 – 2019-04-01 (×3): 10 mg via ORAL
  Filled 2019-03-30 (×4): qty 1

## 2019-03-30 MED ORDER — VANCOMYCIN HCL 10 G IV SOLR
1500.0000 mg | INTRAVENOUS | Status: DC
  Filled 2019-03-30: qty 1500

## 2019-03-30 MED ORDER — LOSARTAN POTASSIUM 50 MG PO TABS
50.0000 mg | ORAL_TABLET | Freq: Every day | ORAL | Status: DC
  Administered 2019-03-31: 50 mg via ORAL
  Filled 2019-03-30 (×2): qty 1

## 2019-03-30 MED ORDER — LEVOTHYROXINE SODIUM 75 MCG PO TABS
75.0000 ug | ORAL_TABLET | Freq: Every day | ORAL | Status: DC
  Administered 2019-03-31 – 2019-04-01 (×2): 75 ug via ORAL
  Filled 2019-03-30 (×2): qty 1

## 2019-03-30 MED ORDER — BISACODYL 5 MG PO TBEC: 5.0000 mg | DELAYED_RELEASE_TABLET | Freq: Every day | ORAL | Status: DC | PRN

## 2019-03-30 MED ORDER — BUPIVACAINE LIPOSOME 1.3 % IJ SUSP
INTRAMUSCULAR | Status: DC | PRN
  Administered 2019-03-30: 20 mL

## 2019-03-30 MED ORDER — ACETAMINOPHEN 650 MG RE SUPP: 650.0000 mg | RECTAL | Status: DC | PRN

## 2019-03-30 MED ORDER — BUPIVACAINE-EPINEPHRINE 0.25% -1:200000 IJ SOLN
INTRAMUSCULAR | Status: DC | PRN
  Administered 2019-03-30: 30 mL

## 2019-03-30 MED ORDER — LIDOCAINE 2% (20 MG/ML) 5 ML SYRINGE
INTRAMUSCULAR | Status: DC | PRN
  Administered 2019-03-30: 100 mg via INTRAVENOUS

## 2019-03-30 MED ORDER — HYDROCODONE-ACETAMINOPHEN 10-325 MG PO TABS
1.0000 | ORAL_TABLET | ORAL | Status: DC | PRN
  Administered 2019-03-30 – 2019-04-01 (×9): 2 via ORAL
  Filled 2019-03-30 (×9): qty 2

## 2019-03-30 MED ORDER — HYDROMORPHONE HCL 1 MG/ML IJ SOLN
0.2500 mg | INTRAMUSCULAR | Status: DC | PRN
  Administered 2019-03-30: 0.5 mg via INTRAVENOUS

## 2019-03-30 MED ORDER — POVIDONE-IODINE 7.5 % EX SOLN
Freq: Once | CUTANEOUS | Status: DC
  Filled 2019-03-30: qty 118

## 2019-03-30 MED ORDER — MENTHOL 3 MG MT LOZG: 1.0000 | LOZENGE | OROMUCOSAL | Status: DC | PRN

## 2019-03-30 MED ORDER — MORPHINE SULFATE (PF) 2 MG/ML IV SOLN
INTRAVENOUS | Status: AC
  Filled 2019-03-30: qty 1

## 2019-03-30 MED ORDER — MIDAZOLAM HCL 5 MG/5ML IJ SOLN
INTRAMUSCULAR | Status: DC | PRN
  Administered 2019-03-30: 2 mg via INTRAVENOUS

## 2019-03-30 MED ORDER — PANTOPRAZOLE SODIUM 40 MG PO TBEC
40.0000 mg | DELAYED_RELEASE_TABLET | Freq: Every day | ORAL | Status: DC
  Administered 2019-03-30 – 2019-04-01 (×2): 40 mg via ORAL
  Filled 2019-03-30 (×2): qty 1

## 2019-03-30 MED ORDER — DOCUSATE SODIUM 100 MG PO CAPS
100.0000 mg | ORAL_CAPSULE | Freq: Two times a day (BID) | ORAL | Status: DC
  Administered 2019-03-30 – 2019-04-01 (×4): 100 mg via ORAL
  Filled 2019-03-30 (×4): qty 1

## 2019-03-30 MED ORDER — PROPOFOL 10 MG/ML IV BOLUS
INTRAVENOUS | Status: DC | PRN
  Administered 2019-03-30: 120 mg via INTRAVENOUS
  Administered 2019-03-30: 50 mg via INTRAVENOUS

## 2019-03-30 MED ORDER — ONDANSETRON HCL 4 MG PO TABS: 4.0000 mg | ORAL_TABLET | Freq: Four times a day (QID) | ORAL | Status: DC | PRN

## 2019-03-30 MED ORDER — PHENOL 1.4 % MT LIQD: 1.0000 | OROMUCOSAL | Status: DC | PRN

## 2019-03-30 MED ORDER — BUPIVACAINE LIPOSOME 1.3 % IJ SUSP
20.0000 mL | INTRAMUSCULAR | Status: DC
  Filled 2019-03-30: qty 20

## 2019-03-30 MED ORDER — FLEET ENEMA 7-19 GM/118ML RE ENEM: 1.0000 | ENEMA | Freq: Once | RECTAL | Status: DC | PRN

## 2019-03-30 MED ORDER — ONDANSETRON HCL 4 MG/2ML IJ SOLN
INTRAMUSCULAR | Status: DC | PRN
  Administered 2019-03-30: 4 mg via INTRAVENOUS

## 2019-03-30 MED ORDER — HYDROMORPHONE HCL 1 MG/ML IJ SOLN
INTRAMUSCULAR | Status: AC
  Filled 2019-03-30: qty 1

## 2019-03-30 MED ORDER — SODIUM CHLORIDE 0.9 % IV SOLN
250.0000 mL | INTRAVENOUS | Status: DC
  Administered 2019-03-31 (×2): via INTRAVENOUS

## 2019-03-30 MED ORDER — DIAZEPAM 5 MG PO TABS
5.0000 mg | ORAL_TABLET | Freq: Four times a day (QID) | ORAL | Status: DC | PRN
  Administered 2019-03-30 – 2019-04-01 (×6): 5 mg via ORAL
  Filled 2019-03-30 (×6): qty 1

## 2019-03-30 MED ORDER — ACETAMINOPHEN 325 MG PO TABS: 650.0000 mg | ORAL_TABLET | ORAL | Status: DC | PRN

## 2019-03-30 MED ORDER — MORPHINE SULFATE (PF) 2 MG/ML IV SOLN
1.0000 mg | INTRAVENOUS | Status: DC | PRN
  Administered 2019-03-30 – 2019-03-31 (×3): 2 mg via INTRAVENOUS
  Filled 2019-03-30 (×2): qty 1

## 2019-03-30 MED ORDER — THROMBIN 20000 UNITS EX SOLR
CUTANEOUS | Status: AC
  Filled 2019-03-30: qty 20000

## 2019-03-30 MED ORDER — ACETAMINOPHEN 500 MG PO TABS
1000.0000 mg | ORAL_TABLET | Freq: Once | ORAL | Status: AC
  Administered 2019-03-30: 1000 mg via ORAL
  Filled 2019-03-30: qty 2

## 2019-03-30 MED ORDER — ESTRADIOL 0.05 MG/24HR TD PTWK: 0.0500 mg | MEDICATED_PATCH | TRANSDERMAL | Status: DC

## 2019-03-30 MED ORDER — SUCCINYLCHOLINE CHLORIDE 20 MG/ML IJ SOLN
INTRAMUSCULAR | Status: DC | PRN
  Administered 2019-03-30: 120 mg via INTRAVENOUS

## 2019-03-30 MED ORDER — SODIUM CHLORIDE 0.9% FLUSH
3.0000 mL | Freq: Two times a day (BID) | INTRAVENOUS | Status: DC
  Administered 2019-03-31: 3 mL via INTRAVENOUS

## 2019-03-30 MED ORDER — 0.9 % SODIUM CHLORIDE (POUR BTL) OPTIME
TOPICAL | Status: DC | PRN
  Administered 2019-03-30 (×2): 1000 mL

## 2019-03-30 MED ORDER — VITAMIN C 500 MG PO TABS
500.0000 mg | ORAL_TABLET | Freq: Every day | ORAL | Status: DC
  Administered 2019-03-30 – 2019-04-01 (×2): 500 mg via ORAL
  Filled 2019-03-30 (×2): qty 1

## 2019-03-30 MED ORDER — BUPIVACAINE-EPINEPHRINE (PF) 0.25% -1:200000 IJ SOLN
INTRAMUSCULAR | Status: AC
  Filled 2019-03-30: qty 30

## 2019-03-30 MED ORDER — ESTRADIOL 0.05 MG/24HR TD PTWK
0.0500 mg | MEDICATED_PATCH | TRANSDERMAL | Status: DC
  Filled 2019-03-30: qty 1

## 2019-03-30 MED ORDER — VANCOMYCIN HCL 10 G IV SOLR
1250.0000 mg | Freq: Once | INTRAVENOUS | Status: AC
  Administered 2019-03-30: 17:00:00 1250 mg via INTRAVENOUS
  Filled 2019-03-30: qty 1250

## 2019-03-30 MED ORDER — ALUM & MAG HYDROXIDE-SIMETH 200-200-20 MG/5ML PO SUSP: 30.0000 mL | Freq: Four times a day (QID) | ORAL | Status: DC | PRN

## 2019-03-30 MED ORDER — ZOLPIDEM TARTRATE 5 MG PO TABS: 5.0000 mg | ORAL_TABLET | Freq: Every evening | ORAL | Status: DC | PRN

## 2019-03-30 MED ORDER — GABAPENTIN 600 MG PO TABS
600.0000 mg | ORAL_TABLET | Freq: Three times a day (TID) | ORAL | Status: DC
  Administered 2019-03-30 – 2019-04-01 (×5): 600 mg via ORAL
  Filled 2019-03-30 (×5): qty 1

## 2019-03-30 MED ORDER — FENTANYL CITRATE (PF) 100 MCG/2ML IJ SOLN
INTRAMUSCULAR | Status: DC | PRN
  Administered 2019-03-30: 50 ug via INTRAVENOUS
  Administered 2019-03-30: 100 ug via INTRAVENOUS
  Administered 2019-03-30 (×2): 50 ug via INTRAVENOUS

## 2019-03-30 MED ORDER — SENNOSIDES-DOCUSATE SODIUM 8.6-50 MG PO TABS: 1.0000 | ORAL_TABLET | Freq: Every evening | ORAL | Status: DC | PRN

## 2019-03-30 MED ORDER — ONDANSETRON HCL 4 MG/2ML IJ SOLN: 4.0000 mg | Freq: Four times a day (QID) | INTRAMUSCULAR | Status: DC | PRN

## 2019-03-30 MED ORDER — SODIUM CHLORIDE 0.9% FLUSH: 3.0000 mL | INTRAVENOUS | Status: DC | PRN

## 2019-03-30 MED ORDER — PROPOFOL 500 MG/50ML IV EMUL
INTRAVENOUS | Status: DC | PRN
  Administered 2019-03-30: 75 ug/kg/min via INTRAVENOUS
  Administered 2019-03-30: 60 ug/kg/min via INTRAVENOUS

## 2019-03-30 MED ORDER — POTASSIUM CHLORIDE IN NACL 20-0.9 MEQ/L-% IV SOLN: INTRAVENOUS | Status: DC

## 2019-03-30 MED ORDER — THROMBIN 20000 UNITS EX SOLR
CUTANEOUS | Status: DC | PRN
  Administered 2019-03-30: 20 mL

## 2019-03-30 MED ORDER — PHENYLEPHRINE HCL (PRESSORS) 10 MG/ML IV SOLN
INTRAVENOUS | Status: DC | PRN
  Administered 2019-03-30 (×2): 80 ug via INTRAVENOUS

## 2019-03-30 MED ORDER — PROMETHAZINE HCL 25 MG/ML IJ SOLN: 6.2500 mg | INTRAMUSCULAR | Status: DC | PRN

## 2019-03-30 MED ORDER — PROPOFOL 10 MG/ML IV BOLUS
INTRAVENOUS | Status: AC
  Filled 2019-03-30: qty 20

## 2019-03-30 MED ORDER — SODIUM CHLORIDE 0.9 % IV SOLN
INTRAVENOUS | Status: DC | PRN
  Administered 2019-03-30: 15 ug/min via INTRAVENOUS

## 2019-03-30 SURGICAL SUPPLY — 66 items
BENZOIN TINCTURE PRP APPL 2/3 (GAUZE/BANDAGES/DRESSINGS) ×4 IMPLANT
BLADE CLIPPER SURG (BLADE) IMPLANT
BLADE SURG 10 STRL SS (BLADE) ×4 IMPLANT
BONE VIVIGEN FORMABLE 10CC (Bone Implant) ×4 IMPLANT
CAGE COROENT 12X18X50 (Cage) ×4 IMPLANT
CLOSURE WOUND 1/2 X4 (GAUZE/BANDAGES/DRESSINGS) ×1
CONT SPEC 4OZ CLIKSEAL STRL BL (MISCELLANEOUS) ×4 IMPLANT
COVER BACK TABLE 80X110 HD (DRAPES) ×4 IMPLANT
COVER SURGICAL LIGHT HANDLE (MISCELLANEOUS) ×4 IMPLANT
COVER WAND RF STERILE (DRAPES) ×4 IMPLANT
DRAPE C-ARM 42X72 X-RAY (DRAPES) ×4 IMPLANT
DRAPE C-ARMOR (DRAPES) ×4 IMPLANT
DRAPE POUCH INSTRU U-SHP 10X18 (DRAPES) ×4 IMPLANT
DRAPE SURG 17X23 STRL (DRAPES) ×16 IMPLANT
DURAPREP 26ML APPLICATOR (WOUND CARE) ×4 IMPLANT
ELECT BLADE 6.5 EXT (BLADE) ×4 IMPLANT
ELECT CAUTERY BLADE 6.4 (BLADE) ×4 IMPLANT
ELECT REM PT RETURN 9FT ADLT (ELECTROSURGICAL) ×4
ELECTRODE REM PT RTRN 9FT ADLT (ELECTROSURGICAL) ×2 IMPLANT
GAUZE 4X4 16PLY RFD (DISPOSABLE) ×4 IMPLANT
GAUZE SPONGE 4X4 12PLY STRL (GAUZE/BANDAGES/DRESSINGS) ×4 IMPLANT
GLOVE BIO SURGEON STRL SZ7 (GLOVE) ×8 IMPLANT
GLOVE BIO SURGEON STRL SZ8 (GLOVE) ×4 IMPLANT
GLOVE BIOGEL PI IND STRL 7.0 (GLOVE) ×2 IMPLANT
GLOVE BIOGEL PI IND STRL 8 (GLOVE) ×2 IMPLANT
GLOVE BIOGEL PI INDICATOR 7.0 (GLOVE) ×2
GLOVE BIOGEL PI INDICATOR 8 (GLOVE) ×2
GOWN STRL REUS W/ TWL LRG LVL3 (GOWN DISPOSABLE) ×2 IMPLANT
GOWN STRL REUS W/ TWL XL LVL3 (GOWN DISPOSABLE) ×4 IMPLANT
GOWN STRL REUS W/TWL LRG LVL3 (GOWN DISPOSABLE) ×2
GOWN STRL REUS W/TWL XL LVL3 (GOWN DISPOSABLE) ×4
IV CATH 14GX2 1/4 (CATHETERS) ×4 IMPLANT
KIT BASIN OR (CUSTOM PROCEDURE TRAY) ×4 IMPLANT
KIT DILATOR XLIF 5 (KITS) ×3 IMPLANT
KIT SURGICAL ACCESS MAXCESS 4 (KITS) ×4 IMPLANT
KIT TURNOVER KIT B (KITS) ×4 IMPLANT
KIT XLIF (KITS) ×1
MARKER SKIN DUAL TIP RULER LAB (MISCELLANEOUS) ×4 IMPLANT
MODULE EMG NEEDLE SSEP NVM5 (NEEDLE) ×4 IMPLANT
MODULE NVM5 NEXT GEN EMG (NEEDLE) ×4 IMPLANT
NEEDLE HYPO 25GX1X1/2 BEV (NEEDLE) ×4 IMPLANT
NEEDLE SPNL 18GX3.5 QUINCKE PK (NEEDLE) ×4 IMPLANT
NS IRRIG 1000ML POUR BTL (IV SOLUTION) ×8 IMPLANT
PACK LAMINECTOMY ORTHO (CUSTOM PROCEDURE TRAY) ×4 IMPLANT
PACK UNIVERSAL I (CUSTOM PROCEDURE TRAY) ×4 IMPLANT
PAD ARMBOARD 7.5X6 YLW CONV (MISCELLANEOUS) ×8 IMPLANT
SPONGE INTESTINAL PEANUT (DISPOSABLE) ×8 IMPLANT
SPONGE LAP 4X18 RFD (DISPOSABLE) ×4 IMPLANT
SPONGE SURGIFOAM ABS GEL 100 (HEMOSTASIS) IMPLANT
STAPLER VISISTAT 35W (STAPLE) ×4 IMPLANT
STRIP CLOSURE SKIN 1/2X4 (GAUZE/BANDAGES/DRESSINGS) ×3 IMPLANT
SURGIFLO W/THROMBIN 8M KIT (HEMOSTASIS) IMPLANT
SUT MNCRL AB 4-0 PS2 18 (SUTURE) ×4 IMPLANT
SUT VIC AB 0 CT1 18XCR BRD 8 (SUTURE) ×2 IMPLANT
SUT VIC AB 0 CT1 8-18 (SUTURE) ×2
SUT VIC AB 1 CT1 18XCR BRD 8 (SUTURE) ×2 IMPLANT
SUT VIC AB 1 CT1 8-18 (SUTURE) ×2
SUT VIC AB 2-0 CT2 18 VCP726D (SUTURE) ×4 IMPLANT
SYR BULB IRRIGATION 50ML (SYRINGE) ×4 IMPLANT
SYR CONTROL 10ML LL (SYRINGE) ×4 IMPLANT
TAPE CLOTH SURG 4X10 WHT LF (GAUZE/BANDAGES/DRESSINGS) ×4 IMPLANT
TOWEL GREEN STERILE (TOWEL DISPOSABLE) ×4 IMPLANT
TOWEL GREEN STERILE FF (TOWEL DISPOSABLE) ×4 IMPLANT
TRAY FOLEY MTR SLVR 16FR STAT (SET/KITS/TRAYS/PACK) ×4 IMPLANT
WATER STERILE IRR 1000ML POUR (IV SOLUTION) ×4 IMPLANT
YANKAUER SUCT BULB TIP NO VENT (SUCTIONS) ×4 IMPLANT

## 2019-03-30 NOTE — Op Note (Signed)
PATIENT NAME: Julie Hess   MEDICAL RECORD NO.:   [id number]    PHYSICIAN:  Julie BambergMark Mylea Roarty, MD      DATE OF BIRTH: 08/05/1971   DATE OF PROCEDURE: 03/30/2019                                                                           OPERATIVE REPORT   PREOPERATIVE DIAGNOSES: 1.  Left-sided L3 radiculopathy. 2.  L3/4 spinal stenosis 3.  S/p previous L4-S1 fusion by another provider   POSTOPERATIVE DIAGNOSES: 1.  Left-sided L3 radiculopathy. 2.  L3/4 spinal stenosis 3.  S/p previous L4-S1 fusion by another provider   PROCEDURE (Stage 1 of 2):  1.  Left-sided lateral interbody fusion, L3/4  via direct lateral retroperitoneal approach. 2.  Insertion of interbody device x1 (12 x 18 mm x 55 mm NuVasive intervertebral spacer). 3.  Use of morselized allograft -- VivaGen.   4.  Intraoperative use of fluoroscopy.   SURGEON:  Julie BambergMark Sharlyn Odonnel, MD   ASSISTANT:  Julie CoopKayla McKenzie PA-C.   ANESTHESIA:  General endotracheal anesthesia.   COMPLICATIONS:  None.   DISPOSITION:  Stable.   ESTIMATED BLOOD LOSS:  Minimal.   INDICATIONS:  Briefly, the patient is a very pleasant 48 year old female who did present to me with ongoing pain and weakness in the left leg.  She is noted to be status post a previous decompression at L4-Sq1 fusion procedure.  She did get excellent relief from an L3 selective nerve block.  Her MRI did reveal the findings outlined above.  Given her ongoing pain and dysfunction, we did discuss proceeding with the procedure reflected above.  The patient did wish to proceed, after a full understanding of the risks and benefits of surgery.   DESCRIPTION OF PROCEDURE:  On 03/30/2019 the patient was brought to surgery and general endotracheal anesthesia was administered.  The patient was placed in the lateral decubitus position, with the left side up.  Neurologic monitoring leads were placed by the monitoring technician.  The patient's torso and lower extremities were secured to the bed.   The patient's hips and knees were flexed in order to lessen the tension on the psoas musculature.  The left flank was then prepped and draped in the usual sterile fashion.  The bed was flexed, in order to optimize exposure to the L3/4 intervertebral space.  After a timeout procedure was performed, a left-sided transverse incision was made over the left flank overlying the L3/4 intervertebral space.  The retroperitoneal space was encountered, after dissection through the oblique musculature.  The peritoneum was bluntly swept anteriorly, and the psoas was readily identified.  I did use a series of dilators to dock over the L3/4 intervertebral space.  I did use neurologic monitoring while placing the dilators, in order to ensure that there were no neurologic structures in the immediate vicinity of the dilators.  The lumbar plexus was noted to be posterior.  A self-retaining retractor was placed, and was attached to a rigid arm.  The retractor was very gently dilated and a shim was placed into the L3/4 intervertebral space.  I then used a knife to perform an annulotomy at the lateral aspect of the L3/4 intervertebral space.  I  then used a series of curettes and pituitary rongeurs in order to perform a thorough and complete L3/4 intervertebral diskectomy.  The contralateral annulus was released.  I then placed a series of intervertebral spacer trials, and I did feel that a lordotic 12 x 18 mm x 50 mm spacer would be the most appropriate fit.  The spacer was then packed with ViviGen and tamped into position.  I was very pleased with the final resting position of the intervertebral spacer.  Excellent height restoration was noted. I was very pleased with the final AP and lateral fluoroscopic images and the excellent restoration of disk height identified on both AP and lateral images.  At this point, the wound was copiously irrigated.  The fascia, internal, and external oblique musculature was closed using #1 Vicryl.  The  subcutaneous layer was  closed using 2-0 Vicryl and the skin was closed using 4-0 Monocryl.     Of note, I did use neurologic monitoring throughout the entire surgery, and there was no sustained EMG activity noted throughout the entire surgery. All instrument counts were correct at the termination of the procedure.   Of note, Julie Hess was my assistant throughout surgery, and did aid in retraction, suctioning, and closure.

## 2019-03-30 NOTE — Anesthesia Preprocedure Evaluation (Addendum)
Anesthesia Evaluation  Patient identified by MRN, date of birth, ID band Patient awake    Reviewed: Allergy & Precautions, NPO status , Patient's Chart, lab work & pertinent test results  Airway Mallampati: II  TM Distance: >3 FB Neck ROM: Full    Dental no notable dental hx. (+) Teeth Intact   Pulmonary asthma , sleep apnea , former smoker,    Pulmonary exam normal breath sounds clear to auscultation       Cardiovascular Exercise Tolerance: Good hypertension, Pt. on medications Normal cardiovascular exam Rhythm:Regular Rate:Normal     Neuro/Psych  Headaches, Anxiety    GI/Hepatic GERD  Medicated,  Endo/Other  Hypothyroidism   Renal/GU negative Renal ROS     Musculoskeletal   Abdominal (+) + obese,   Peds  Hematology  (+) Blood dyscrasia, anemia ,   Anesthesia Other Findings   Reproductive/Obstetrics                           Lab Results  Component Value Date   WBC 8.9 03/28/2019   HGB 13.8 03/28/2019   HCT 44.2 03/28/2019   MCV 91.1 03/28/2019   PLT 304 03/28/2019   Lab Results  Component Value Date   CREATININE 0.84 03/28/2019   BUN 12 03/28/2019   NA 138 03/28/2019   K 4.2 03/28/2019   CL 105 03/28/2019   CO2 21 (L) 03/28/2019    Anesthesia Physical Anesthesia Plan  ASA: III  Anesthesia Plan: General   Post-op Pain Management:    Induction: Intravenous  PONV Risk Score and Plan: 4 or greater and Treatment may vary due to age or medical condition, Ondansetron, Dexamethasone, Midazolam and Promethazine  Airway Management Planned: Video Laryngoscope Planned and Oral ETT  Additional Equipment:   Intra-op Plan:   Post-operative Plan: Extubation in OR  Informed Consent: I have reviewed the patients History and Physical, chart, labs and discussed the procedure including the risks, benefits and alternatives for the proposed anesthesia with the patient or authorized  representative who has indicated his/her understanding and acceptance.     Dental advisory given  Plan Discussed with: CRNA  Anesthesia Plan Comments:        Anesthesia Quick Evaluation

## 2019-03-30 NOTE — H&P (Signed)
PREOPERATIVE H&P  Chief Complaint: Right leg pain  HPI: Julie Hess is a 48 y.o. 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 Anemia    with ulcerative colitis  . Anxiety   . Asthma    very mild per pt  . Complication of anesthesia    Hard time waking up after C-Section 1997, but tolerated anesthesia since then no issues  . Dyspnea    with activity  . GERD (gastroesophageal reflux disease)   . Hyperlipidemia   . Hypertension   . Hypothyroidism   . Migraines    migraines  . Sleep apnea    uses cpap  . Thyroid disease   . Ulcerative colitis, chronic (HCC)    Past Surgical History:  Procedure Laterality Date  . ABDOMINAL HYSTERECTOMY  2002  . ANTERIOR CERVICAL DECOMPRESSION/DISCECTOMY FUSION 4 LEVEL/HARDWARE REMOVAL N/A 08/26/2018   Procedure: ANTERIOR CERVICAL DECOMPRESSION FUSION, CERVICAL 4-5, CERVICAL 5-6, CERVICAL 6-7 WITH INSTRUMENTATION AND ALLOGRAFT;  Surgeon: Estill Bambergumonski, Quintavia Rogstad, MD;  Location: [facility] OR;  Service: Orthopedics;  Laterality: N/A;  . APPENDECTOMY    . BACK SURGERY  2019   lumbar fusion  . BACK SURGERY     discetomy  . CESAREAN SECTION  1996  . CHOLECYSTECTOMY    . SPINE SURGERY  [id number]   lumbar   Social History   Socioeconomic History  . Marital status: Married    Spouse name: Not on file  . Number of children: Not on file  . Years of education: Not on file  . Highest education level: Not on file  Occupational History  . Not on file  Social Needs  . Financial resource strain: Not on file  . Food insecurity    Worry: Not on file    Inability: Not on file  . Transportation needs    Medical: Not on file   Non-medical: Not on file  Tobacco Use  . Smoking status: Former Smoker    Quit date: 08/11/2004    Years since quitting: 14.6  . Smokeless tobacco: Never Used  Substance and Sexual Activity  . Alcohol use: Yes    Comment: occasional  . Drug use: No  . Sexual activity: Not on file  Lifestyle  . Physical activity    Days per week: Not on file    Minutes per session: Not on file  . Stress: Not on file  Relationships  . Social Musicianconnections    Talks on phone: Not on file    Gets together: Not on file    Attends religious service: Not on file    Active member of club or organization: Not on file    Attends meetings of clubs or organizations: Not on file    Relationship status: Not on file  Other Topics Concern  . Not on file  Social History Narrative  . Not on file   History reviewed. No pertinent family history. Allergies  Allergen Reactions  . Penicillins Hives, Shortness Of Breath and Other (See Comments)    DID THE REACTION INVOLVE: Swelling of the face/tongue/throat, SOB, or low BP? #  #  #  YES  #  #  #  Sudden or  severe rash/hives, skin peeling, or the inside of the mouth or nose? No Did it require medical treatment? No When did it last happen?2016   . Other Other (See Comments)    Anti-depressants  . Oxycodone     Prefers Hydrocodone - Oxycodone causes nausea and dizziness   Prior to Admission medications   Medication Sig Start Date End Date Taking? Authorizing Provider  aspirin EC 81 MG tablet Take 81 mg by mouth at bedtime.   Yes [provider]  busPIRone (BUSPAR) 10 MG tablet Take 10 mg by mouth 2 (two) times daily.    Yes [provider]  estradiol (VIVELLE-DOT) 0.05 MG/24HR patch Place 1 patch onto the skin 2 (two) times a week.   Yes [provider]  gabapentin (NEURONTIN) 600 MG tablet Take 600 mg by mouth 3 (three) times daily.   Yes [provider]  levothyroxine (SYNTHROID, LEVOTHROID) 75 MCG tablet Take 75 mcg by mouth  daily before breakfast.  07/07/15  Yes [provider]  losartan (COZAAR) 50 MG tablet Take 50 mg by mouth at bedtime.   Yes [provider]  meloxicam (MOBIC) 15 MG tablet Take 15 mg by mouth daily as needed for pain.   Yes [provider]  methocarbamol (ROBAXIN) 500 MG tablet Take 500 mg by mouth every 8 (eight) hours as needed for muscle spasms.   Yes [provider]  pantoprazole (PROTONIX) 40 MG tablet Take 40 mg by mouth daily.   Yes [provider]  pravastatin (PRAVACHOL) 40 MG tablet Take 40 mg by mouth at bedtime.   Yes [provider]  vitamin C (ASCORBIC ACID) 500 MG tablet Take 500 mg by mouth daily.   Yes [provider]     All other systems have been reviewed and were otherwise negative with the exception of those mentioned in the HPI and as above.  Physical Exam: Vitals:   03/30/19 0723  BP: 138/75  Pulse: 82  Resp: 20  Temp: 98.2 F (36.8 C)  SpO2: 98%    There is no height or weight on file to calculate BMI.  General: Alert, no acute distress Cardiovascular: No pedal edema Respiratory: No cyanosis, no use of accessory musculature Skin: No lesions in the area of chief complaint Neurologic: Sensation intact distally Psychiatric: Patient is competent for consent with normal mood and affect Lymphatic: No axillary or cervical lymphadenopathy   Assessment/Plan: RIGHT LUMBAR 3 RADICULOPATHY Plan for Procedure(s): LEFT LATERAL LUMBAR 3-4 INTERBODY FUSION WITH INSTRUMENTATION AND ALLOGRAFT   Norva Karvonen, MD 03/30/2019 8:11 AM

## 2019-03-30 NOTE — Transfer of Care (Signed)
Immediate Anesthesia Transfer of Care Note  Patient: Julie Hess  Procedure(s) Performed: LEFT LATERAL LUMBAR THREE-FOUR INTERBODY FUSION WITH INSTRUMENTATION AND ALLOGRAFT (Left Spine Lumbar)  Patient Location: PACU  Anesthesia Type:General  Level of Consciousness: awake, alert , oriented and patient cooperative  Airway & Oxygen Therapy: Patient Spontanous Breathing and Patient connected to nasal cannula oxygen  Post-op Assessment: Report given to RN and Post -op Vital signs reviewed and stable  Post vital signs: Reviewed and stable  Last Vitals:  Vitals Value Taken Time  BP    Temp    Pulse 82 03/30/19 1121  Resp 22 03/30/19 1121  SpO2 97 % 03/30/19 1121  Vitals shown include unvalidated device data.  Last Pain:  Vitals:   03/30/19 1120  PainSc: (P) Asleep      Patients Stated Pain Goal: 2 (50/27/74 1287)  Complications: No apparent anesthesia complications

## 2019-03-30 NOTE — Anesthesia Postprocedure Evaluation (Signed)
Anesthesia Post Note  Patient: Julie Hess  Procedure(s) Performed: LEFT LATERAL LUMBAR THREE-FOUR INTERBODY FUSION WITH INSTRUMENTATION AND ALLOGRAFT (Left Spine Lumbar)     Patient location during evaluation: PACU Anesthesia Type: General Level of consciousness: awake and alert Pain management: pain level controlled Vital Signs Assessment: post-procedure vital signs reviewed and stable Respiratory status: spontaneous breathing, nonlabored ventilation, respiratory function stable and patient connected to nasal cannula oxygen Cardiovascular status: blood pressure returned to baseline and stable Postop Assessment: no apparent nausea or vomiting Anesthetic complications: no    Last Vitals:  Vitals:   03/30/19 1148 03/30/19 1204  BP: 123/65 127/83  Pulse: 84 77  Resp: 15 18  Temp:  36.6 C  SpO2: 97% 98%    Last Pain:  Vitals:   03/30/19 1248  TempSrc:   PainSc: 8                  Ryan P Ellender

## 2019-03-30 NOTE — Progress Notes (Signed)
Pharmacy Antibiotic Note  Julie Hess is a 48 y.o. female admitted on 03/30/2019 with spinal stenosis, now s/p lumbar fusion.  Pharmacy has been consulted for Vancomycin dosing for surgical prophylaxis.  No drain noted. Vancomycin 1500 mg IV x1 preop dose was given today @ 07:48 Patient is going back to OR tomorrow for surgery.  Patient weight: 138 kg,  Height: 65 inches CrCL >100 ml/min  Plan: Give Vancomycin 1250 mg IV x1 tonight at 18:00 Pharmacy will sign off.  Please consult again if needs to continue on IV vancomycin.      Temp (24hrs), Avg:97.9 F (36.6 C), Min:97.7 F (36.5 C), Max:98.2 F (36.8 C)  Recent Labs  Lab 03/28/19 1030  WBC 8.9  CREATININE 0.84    Estimated Creatinine Clearance: 116.8 mL/min (by C-G formula based on SCr of 0.84 mg/dL).    Allergies  Allergen Reactions  . Penicillins Hives, Shortness Of Breath and Other (See Comments)    DID THE REACTION INVOLVE: Swelling of the face/tongue/throat, SOB, or low BP? #  #  #  YES  #  #  #  Sudden or severe rash/hives, skin peeling, or the inside of the mouth or nose? No Did it require medical treatment? No When did it last happen?2016   . Other Other (See Comments)    Anti-depressants  . Oxycodone     Prefers Hydrocodone - Oxycodone causes nausea and dizziness    Thank you for allowing pharmacy to be a part of this patient's care.  Nicole Cella, RPh Clinical Pharmacist [phone] Please check AMION for all [facility] phone numbers After 10:00 PM, call [facility] [phone] 03/30/2019 1:59 PM

## 2019-03-31 ENCOUNTER — Inpatient Hospital Stay (HOSPITAL_COMMUNITY): Payer: No Typology Code available for payment source | Admitting: Certified Registered"

## 2019-03-31 ENCOUNTER — Encounter (HOSPITAL_COMMUNITY): Payer: Self-pay | Admitting: Certified Registered"

## 2019-03-31 ENCOUNTER — Inpatient Hospital Stay (HOSPITAL_COMMUNITY)
Admission: RE | Admit: 2019-03-31 | Payer: No Typology Code available for payment source | Source: Home / Self Care | Admitting: Orthopedic Surgery

## 2019-03-31 ENCOUNTER — Inpatient Hospital Stay (HOSPITAL_COMMUNITY): Payer: No Typology Code available for payment source

## 2019-03-31 ENCOUNTER — Encounter (HOSPITAL_COMMUNITY)
Admission: RE | Disposition: A | Discharge status: Home / Self Care | Payer: Self-pay | Source: Home / Self Care | Attending: Orthopedic Surgery

## 2019-03-31 DIAGNOSIS — E039 Hypothyroidism, unspecified: Secondary | ICD-10-CM | POA: Diagnosis not present

## 2019-03-31 DIAGNOSIS — M48061 Spinal stenosis, lumbar region without neurogenic claudication: Secondary | ICD-10-CM | POA: Diagnosis not present

## 2019-03-31 DIAGNOSIS — M5416 Radiculopathy, lumbar region: Secondary | ICD-10-CM | POA: Diagnosis not present

## 2019-03-31 DIAGNOSIS — I10 Essential (primary) hypertension: Secondary | ICD-10-CM | POA: Diagnosis not present

## 2019-03-31 LAB — APTT: aPTT: 26 seconds (ref 24–36)

## 2019-03-31 LAB — CBC WITH DIFFERENTIAL/PLATELET
Abs Immature Granulocytes: 0.28 10*3/uL — ABNORMAL HIGH (ref 0.00–0.07)
Basophils Absolute: 0 10*3/uL (ref 0.0–0.1)
Basophils Relative: 0 %
Eosinophils Absolute: 0 10*3/uL (ref 0.0–0.5)
Eosinophils Relative: 0 %
HCT: 37 % (ref 36.0–46.0)
Hemoglobin: 11.8 g/dL — ABNORMAL LOW (ref 12.0–15.0)
Immature Granulocytes: 2 %
Lymphocytes Relative: 8 %
Lymphs Abs: 1.2 10*3/uL (ref 0.7–4.0)
MCH: 28.5 pg (ref 26.0–34.0)
MCHC: 31.9 g/dL (ref 30.0–36.0)
MCV: 89.4 fL (ref 80.0–100.0)
Monocytes Absolute: 0.3 10*3/uL (ref 0.1–1.0)
Monocytes Relative: 2 %
Neutro Abs: 13.3 10*3/uL — ABNORMAL HIGH (ref 1.7–7.7)
Neutrophils Relative %: 88 %
Platelets: 316 10*3/uL (ref 150–400)
RBC: 4.14 MIL/uL (ref 3.87–5.11)
RDW: 14 % (ref 11.5–15.5)
WBC: 15.2 10*3/uL — ABNORMAL HIGH (ref 4.0–10.5)
nRBC: 0 % (ref 0.0–0.2)

## 2019-03-31 LAB — COMPREHENSIVE METABOLIC PANEL
ALT: 29 U/L (ref 0–44)
AST: 25 U/L (ref 15–41)
Albumin: 3.5 g/dL (ref 3.5–5.0)
Alkaline Phosphatase: 93 U/L (ref 38–126)
Anion gap: 10 (ref 5–15)
BUN: 9 mg/dL (ref 6–20)
CO2: 24 mmol/L (ref 22–32)
Calcium: 8.4 mg/dL — ABNORMAL LOW (ref 8.9–10.3)
Chloride: 106 mmol/L (ref 98–111)
Creatinine, Ser: 0.79 mg/dL (ref 0.44–1.00)
GFR calc Af Amer: >60 mL/min (ref >60)
GFR calc non Af Amer: >60 mL/min (ref >60)
Glucose, Bld: 160 mg/dL — ABNORMAL HIGH (ref 70–99)
Potassium: 4.1 mmol/L (ref 3.5–5.1)
Sodium: 140 mmol/L (ref 135–145)
Total Bilirubin: 0.6 mg/dL (ref 0.3–1.2)
Total Protein: 6.1 g/dL — ABNORMAL LOW (ref 6.5–8.1)

## 2019-03-31 LAB — PROTIME-INR
INR: 1.1 (ref 0.8–1.2)
Prothrombin Time: 13.9 seconds (ref 11.4–15.2)

## 2019-03-31 SURGERY — POSTERIOR LUMBAR FUSION 1 LEVEL
Anesthesia: General

## 2019-03-31 MED ORDER — LIDOCAINE 2% (20 MG/ML) 5 ML SYRINGE
INTRAMUSCULAR | Status: DC | PRN
  Administered 2019-03-31: 100 mg via INTRAVENOUS

## 2019-03-31 MED ORDER — ACETAMINOPHEN 10 MG/ML IV SOLN
INTRAVENOUS | Status: AC
  Filled 2019-03-31: qty 100

## 2019-03-31 MED ORDER — HYDROMORPHONE HCL 1 MG/ML IJ SOLN
INTRAMUSCULAR | Status: AC
  Filled 2019-03-31: qty 1

## 2019-03-31 MED ORDER — DEXAMETHASONE SODIUM PHOSPHATE 10 MG/ML IJ SOLN
INTRAMUSCULAR | Status: DC | PRN
  Administered 2019-03-31: 10 mg via INTRAVENOUS

## 2019-03-31 MED ORDER — MIDAZOLAM HCL 5 MG/5ML IJ SOLN
INTRAMUSCULAR | Status: DC | PRN
  Administered 2019-03-31: 2 mg via INTRAVENOUS

## 2019-03-31 MED ORDER — THROMBIN 20000 UNITS EX SOLR
CUTANEOUS | Status: DC | PRN
  Administered 2019-03-31: 09:00:00 20 mL via TOPICAL

## 2019-03-31 MED ORDER — OXYCODONE HCL 5 MG PO TABS: 5.0000 mg | ORAL_TABLET | Freq: Once | ORAL | Status: DC | PRN

## 2019-03-31 MED ORDER — GLYCOPYRROLATE PF 0.2 MG/ML IJ SOSY
PREFILLED_SYRINGE | INTRAMUSCULAR | Status: DC | PRN
  Administered 2019-03-31: .1 mg via INTRAVENOUS

## 2019-03-31 MED ORDER — POVIDONE-IODINE 7.5 % EX SOLN: Freq: Once | CUTANEOUS | Status: DC

## 2019-03-31 MED ORDER — EPHEDRINE SULFATE-NACL 50-0.9 MG/10ML-% IV SOSY
PREFILLED_SYRINGE | INTRAVENOUS | Status: DC | PRN
  Administered 2019-03-31: 5 mg via INTRAVENOUS

## 2019-03-31 MED ORDER — PHENYLEPHRINE 40 MCG/ML (10ML) SYRINGE FOR IV PUSH (FOR BLOOD PRESSURE SUPPORT)
PREFILLED_SYRINGE | INTRAVENOUS | Status: DC | PRN
  Administered 2019-03-31: 80 ug via INTRAVENOUS

## 2019-03-31 MED ORDER — ALBUMIN HUMAN 5 % IV SOLN
INTRAVENOUS | Status: DC | PRN
  Administered 2019-03-31: 09:00:00 via INTRAVENOUS

## 2019-03-31 MED ORDER — PROPOFOL 10 MG/ML IV BOLUS
INTRAVENOUS | Status: DC | PRN
  Administered 2019-03-31: 180 mg via INTRAVENOUS

## 2019-03-31 MED ORDER — METHYLENE BLUE 0.5 % INJ SOLN
INTRAVENOUS | Status: DC | PRN
  Administered 2019-03-31: .5 mL

## 2019-03-31 MED ORDER — PROPOFOL 500 MG/50ML IV EMUL
INTRAVENOUS | Status: DC | PRN
  Administered 2019-03-31: 65 ug/kg/min via INTRAVENOUS
  Administered 2019-03-31: 09:00:00 via INTRAVENOUS

## 2019-03-31 MED ORDER — ACETAMINOPHEN 10 MG/ML IV SOLN
1000.0000 mg | Freq: Once | INTRAVENOUS | Status: DC | PRN
  Administered 2019-03-31: 1000 mg via INTRAVENOUS

## 2019-03-31 MED ORDER — VANCOMYCIN HCL 1000 MG IV SOLR
INTRAVENOUS | Status: DC | PRN
  Administered 2019-03-31: 1500 mg via INTRAVENOUS

## 2019-03-31 MED ORDER — ONDANSETRON HCL 4 MG/2ML IJ SOLN: 4.0000 mg | Freq: Once | INTRAMUSCULAR | Status: DC | PRN

## 2019-03-31 MED ORDER — BUPIVACAINE LIPOSOME 1.3 % IJ SUSP
20.0000 mL | INTRAMUSCULAR | Status: AC
  Administered 2019-03-31: 20 mL
  Filled 2019-03-31: qty 20

## 2019-03-31 MED ORDER — SUCCINYLCHOLINE CHLORIDE 200 MG/10ML IV SOSY
PREFILLED_SYRINGE | INTRAVENOUS | Status: DC | PRN
  Administered 2019-03-31: 140 mg via INTRAVENOUS

## 2019-03-31 MED ORDER — 0.9 % SODIUM CHLORIDE (POUR BTL) OPTIME
TOPICAL | Status: DC | PRN
  Administered 2019-03-31 (×2): 1000 mL

## 2019-03-31 MED ORDER — SODIUM CHLORIDE 0.9 % IV SOLN
INTRAVENOUS | Status: DC | PRN
  Administered 2019-03-31: 30 ug/min via INTRAVENOUS

## 2019-03-31 MED ORDER — OXYCODONE HCL 5 MG/5ML PO SOLN: 5.0000 mg | Freq: Once | ORAL | Status: DC | PRN

## 2019-03-31 MED ORDER — ACETAMINOPHEN 500 MG PO TABS: 1000.0000 mg | ORAL_TABLET | Freq: Once | ORAL | Status: DC

## 2019-03-31 MED ORDER — HYDROMORPHONE HCL 1 MG/ML IJ SOLN
0.2500 mg | INTRAMUSCULAR | Status: DC | PRN
  Administered 2019-03-31 (×4): 0.5 mg via INTRAVENOUS

## 2019-03-31 MED ORDER — VANCOMYCIN HCL 10 G IV SOLR
1500.0000 mg | INTRAVENOUS | Status: DC
  Filled 2019-03-31: qty 1500

## 2019-03-31 MED ORDER — ONDANSETRON HCL 4 MG/2ML IJ SOLN
INTRAMUSCULAR | Status: DC | PRN
  Administered 2019-03-31: 4 mg via INTRAVENOUS

## 2019-03-31 MED ORDER — BUPIVACAINE-EPINEPHRINE 0.25% -1:200000 IJ SOLN
INTRAMUSCULAR | Status: DC | PRN
  Administered 2019-03-31: 20 mL
  Administered 2019-03-31: 9 mL

## 2019-03-31 MED ORDER — KETAMINE HCL 10 MG/ML IJ SOLN
INTRAMUSCULAR | Status: DC | PRN
  Administered 2019-03-31: 20 mg via INTRAVENOUS
  Administered 2019-03-31: 30 mg via INTRAVENOUS

## 2019-03-31 MED ORDER — BUPIVACAINE-EPINEPHRINE (PF) 0.5% -1:200000 IJ SOLN
INTRAMUSCULAR | Status: AC
  Filled 2019-03-31: qty 30

## 2019-03-31 MED ORDER — FENTANYL CITRATE (PF) 100 MCG/2ML IJ SOLN
INTRAMUSCULAR | Status: DC | PRN
  Administered 2019-03-31: 50 ug via INTRAVENOUS
  Administered 2019-03-31: 150 ug via INTRAVENOUS
  Administered 2019-03-31: 50 ug via INTRAVENOUS
  Administered 2019-03-31: 100 ug via INTRAVENOUS

## 2019-03-31 SURGICAL SUPPLY — 87 items
BENZOIN TINCTURE PRP APPL 2/3 (GAUZE/BANDAGES/DRESSINGS) ×3 IMPLANT
BLADE CLIPPER SURG (BLADE) IMPLANT
BONE VIVIGEN FORMABLE 1.3CC (Bone Implant) ×3 IMPLANT
BUR PRESCISION 1.7 ELITE (BURR) ×3 IMPLANT
BUR ROUND FLUTED 5 RND (BURR) ×2 IMPLANT
BUR ROUND FLUTED 5MM RND (BURR) ×1
BUR ROUND PRECISION 4.0 (BURR) IMPLANT
BUR ROUND PRECISION 4.0MM (BURR)
BUR SABER RD CUTTING 3.0 (BURR) IMPLANT
BUR SABER RD CUTTING 3.0MM (BURR)
CARTRIDGE OIL MAESTRO DRILL (MISCELLANEOUS) ×1 IMPLANT
CLOSURE WOUND 1/2 X4 (GAUZE/BANDAGES/DRESSINGS) ×1
CONNECTOR EXPEDIUM TI 55MM (Connector) ×4 IMPLANT
CONT SPEC 4OZ CLIKSEAL STRL BL (MISCELLANEOUS) ×3 IMPLANT
COVER MAYO STAND STRL (DRAPES) ×6 IMPLANT
COVER SURGICAL LIGHT HANDLE (MISCELLANEOUS) ×1 IMPLANT
COVER WAND RF STERILE (DRAPES) ×1 IMPLANT
DIFFUSER DRILL AIR PNEUMATIC (MISCELLANEOUS) ×3 IMPLANT
DRAIN CHANNEL 15F RND FF W/TCR (WOUND CARE) IMPLANT
DRAPE C-ARM 42X72 X-RAY (DRAPES) ×3 IMPLANT
DRAPE C-ARMOR (DRAPES) ×2 IMPLANT
DRAPE POUCH INSTRU U-SHP 10X18 (DRAPES) ×3 IMPLANT
DRAPE SURG 17X23 STRL (DRAPES) ×12 IMPLANT
DURAPREP 26ML APPLICATOR (WOUND CARE) ×3 IMPLANT
ELECT BLADE 4.0 EZ CLEAN MEGAD (MISCELLANEOUS) ×3
ELECT CAUTERY BLADE 6.4 (BLADE) ×3 IMPLANT
ELECT REM PT RETURN 9FT ADLT (ELECTROSURGICAL) ×3
ELECTRODE BLDE 4.0 EZ CLN MEGD (MISCELLANEOUS) ×1 IMPLANT
ELECTRODE REM PT RTRN 9FT ADLT (ELECTROSURGICAL) ×1 IMPLANT
EVACUATOR SILICONE 100CC (DRAIN) IMPLANT
FEE INTRAOP MONITOR IMPULS NCS (MISCELLANEOUS) IMPLANT
FILTER STRAW FLUID ASPIR (MISCELLANEOUS) ×3 IMPLANT
GAUZE 4X4 16PLY RFD (DISPOSABLE) ×3 IMPLANT
GAUZE SPONGE 4X4 12PLY STRL (GAUZE/BANDAGES/DRESSINGS) ×3 IMPLANT
GLOVE BIO SURGEON STRL SZ7 (GLOVE) ×3 IMPLANT
GLOVE BIO SURGEON STRL SZ8 (GLOVE) ×3 IMPLANT
GLOVE BIOGEL PI IND STRL 7.0 (GLOVE) ×1 IMPLANT
GLOVE BIOGEL PI IND STRL 8 (GLOVE) ×1 IMPLANT
GLOVE BIOGEL PI INDICATOR 7.0 (GLOVE) ×2
GLOVE BIOGEL PI INDICATOR 8 (GLOVE) ×2
GOWN STRL REUS W/ TWL LRG LVL3 (GOWN DISPOSABLE) ×2 IMPLANT
GOWN STRL REUS W/ TWL XL LVL3 (GOWN DISPOSABLE) ×1 IMPLANT
GOWN STRL REUS W/TWL LRG LVL3 (GOWN DISPOSABLE) ×4
GOWN STRL REUS W/TWL XL LVL3 (GOWN DISPOSABLE) ×2
GRAFT BNE MATRIX VG FRMBL SM 1 (Bone Implant) IMPLANT
INTRAOP MONITOR FEE IMPULS NCS (MISCELLANEOUS) ×1
INTRAOP MONITOR FEE IMPULSE (MISCELLANEOUS) ×2
IV CATH 14GX2 1/4 (CATHETERS) ×3 IMPLANT
KIT BASIN OR (CUSTOM PROCEDURE TRAY) ×3 IMPLANT
KIT POSITION SURG JACKSON T1 (MISCELLANEOUS) ×3 IMPLANT
KIT TURNOVER KIT B (KITS) ×3 IMPLANT
MARKER SKIN DUAL TIP RULER LAB (MISCELLANEOUS) ×6 IMPLANT
NDL 18GX1X1/2 (RX/OR ONLY) (NEEDLE) ×1 IMPLANT
NDL HYPO 25GX1X1/2 BEV (NEEDLE) ×1 IMPLANT
NDL SPNL 18GX3.5 QUINCKE PK (NEEDLE) ×2 IMPLANT
NEEDLE 18GX1X1/2 (RX/OR ONLY) (NEEDLE) ×3 IMPLANT
NEEDLE 22X1 1/2 (OR ONLY) (NEEDLE) ×8 IMPLANT
NEEDLE HYPO 25GX1X1/2 BEV (NEEDLE) ×3 IMPLANT
NEEDLE SPNL 18GX3.5 QUINCKE PK (NEEDLE) ×6 IMPLANT
NS IRRIG 1000ML POUR BTL (IV SOLUTION) ×9 IMPLANT
OIL CARTRIDGE MAESTRO DRILL (MISCELLANEOUS) ×3
PACK LAMINECTOMY ORTHO (CUSTOM PROCEDURE TRAY) ×3 IMPLANT
PACK UNIVERSAL I (CUSTOM PROCEDURE TRAY) ×3 IMPLANT
PAD ARMBOARD 7.5X6 YLW CONV (MISCELLANEOUS) ×2 IMPLANT
PATTIES SURGICAL .5 X1 (DISPOSABLE) ×1 IMPLANT
PATTIES SURGICAL .5X1.5 (GAUZE/BANDAGES/DRESSINGS) ×1 IMPLANT
ROD PRE LORDOSED 5.5X45 (Rod) ×4 IMPLANT
SCREW SET SINGLE INNER (Screw) ×4 IMPLANT
SCREW VIPER CORT FIX 6X35 (Screw) ×4 IMPLANT
SPONGE INTESTINAL PEANUT (DISPOSABLE) ×3 IMPLANT
SPONGE SURGIFOAM ABS GEL 100 (HEMOSTASIS) ×3 IMPLANT
STRIP CLOSURE SKIN 1/2X4 (GAUZE/BANDAGES/DRESSINGS) ×3 IMPLANT
SURGIFLO W/THROMBIN 8M KIT (HEMOSTASIS) IMPLANT
SUT MNCRL AB 4-0 PS2 18 (SUTURE) ×3 IMPLANT
SUT VIC AB 0 CT1 18XCR BRD 8 (SUTURE) ×1 IMPLANT
SUT VIC AB 0 CT1 8-18 (SUTURE) ×2
SUT VIC AB 1 CT1 18XCR BRD 8 (SUTURE) ×1 IMPLANT
SUT VIC AB 1 CT1 8-18 (SUTURE) ×2
SUT VIC AB 2-0 CT2 18 VCP726D (SUTURE) ×5 IMPLANT
SYR 20ML LL LF (SYRINGE) ×6 IMPLANT
SYR BULB IRRIGATION 50ML (SYRINGE) ×3 IMPLANT
SYR CONTROL 10ML LL (SYRINGE) ×6 IMPLANT
SYR TB 1ML LUER SLIP (SYRINGE) ×3 IMPLANT
TAPE CLOTH 4X10 WHT NS (GAUZE/BANDAGES/DRESSINGS) ×2 IMPLANT
TRAY FOLEY MTR SLVR 16FR STAT (SET/KITS/TRAYS/PACK) ×3 IMPLANT
WATER STERILE IRR 1000ML POUR (IV SOLUTION) ×3 IMPLANT
YANKAUER SUCT BULB TIP NO VENT (SUCTIONS) ×3 IMPLANT

## 2019-03-31 NOTE — Op Note (Signed)
PATIENT NAME: Julie PenRebekah Hess   MEDICAL RECORD NO.:   [id number]    PHYSICIAN:  Estill BambergMark Joli Koob, MD      DATE OF BIRTH: 03/07/1971   DATE OF PROCEDURE: 03/31/19                                                                           OPERATIVE REPORT   PREOPERATIVE DIAGNOSES: 1. Left-sided L3radiculopathy. 2.  L3/4 spinal stenosis 3.S/p previous L4-S1 fusion by another provider 4.  S/p L3/4 lateral interbody fusion, requiring posterior fusion with fixation   POSTOPERATIVE DIAGNOSES: 1. Left-sided L3radiculopathy. 2.  L3/4 spinal stenosis 3.S/p previous L4-S1 fusion by another provider 4.  S/p L3/4 lateral interbody fusion, requiring posterior fusion with fixation   PROCEDURE (Stage 2):  1.  L3/4 posterior spinal fusion 2.  Placement of posterior segmental fixation, extended up to L3 3.  Use of allograft - Vivigen 4.  Use of Flouroscopy   SURGEON:  Estill BambergMark Shakoya Gilmore, MD   ASSISTANT:  Jason CoopKayla McKenzie PA-C.   ANESTHESIA:  General endotracheal anesthesia.   COMPLICATIONS:  None.   DISPOSITION:  Stable.   ESTIMATED BLOOD LOSS:  Minimal.   INDICATIONS:  Briefly, Ms. Mayford KnifeWilliams presents today for stage 2 of her procedure, as reflected above. Refer to op report dated 03/30/2019 for a full account of the patient's indication for surgery.    DESCRIPTION OF PROCEDURE:  On 03/31/2019 the patient was brought to surgery and general endotracheal anesthesia was administered.  The patient was placed prone on a spinal frame. Neurologic monitoring leads were placed by the monitoring technician. The lumbar region was then prepped and draped in the usual sterile fashion.  After a timeout procedure was performed, a midline incision was made overlying the L3/4 intervertebral space.  The bilateral L3 pedicle entry points were subperiosteally exposed.  The specific entry points were identified using AP fluoroscopy.  I then used a high-speed bur and a gearshift probe to cannulate the L3 pedicles, using a  medial to lateral cortical trajectory technique.  Once the L3 pedicles were cannulated, I did use a high-speed bur to decorticate the bilateral L3-4 facet joints.  At this point, a 6 x 35 mm screws were advanced into the L3 pedicles bilaterally.  A rod was placed into the tulip heads of the pedicle screws, and extended inferiorly, just medial to the previously placed rods.  A cross connector was placed, connecting the new rod and the previously placed rod.  Caps were placed and a final locking procedure was performed over the caps, and the cross connector was locked to the previous rod as well.  At this point, allograft in the form of Vivigen was placed along the bilateral L3-4 facet joints and posterior lateral gutter, to aid in the success of the posterior fusion. The wound was copiously irrigated prior to placing the bone graft.  I was very pleased with the AP and lateral fluoroscopic images.  The wound was then closed using #1 Vicryl followed by 2-0 Vicryl followed by 4-0 Monocryl.  Benzoin and Steri-Strips were applied over the left lateral wound and left posterior wound, followed by sterile dressing.     Of note, I did use neurologic monitoring throughout the entire  surgery, and there was no sustained EMG activity noted throughout the entire surgery. All instrument counts were correct at the termination of the procedure.   Of note, [name] was my assistant throughout surgery, and did aid in retraction, suctioning, and closure.  Phylliss Bob, MD

## 2019-03-31 NOTE — H&P (Signed)
Patient tolerated stage 1 well, and presents today for stage 2 of her surgery. Will proceed as planned.

## 2019-03-31 NOTE — Transfer of Care (Signed)
Immediate Anesthesia Transfer of Care Note  Patient: Julie Hess  Procedure(s) Performed: LUMBAR THREE- LUMBAR FOUR POSTERIOR SPINAL FUSION WITH INSTRUMENTATION AND ALLOGRAFT (N/A )  Patient Location: PACU  Anesthesia Type:General  Level of Consciousness: awake and patient cooperative  Airway & Oxygen Therapy: Patient Spontanous Breathing and Patient connected to face mask oxygen  Post-op Assessment: Report given to RN, Post -op Vital signs reviewed and stable and Patient moving all extremities X 4  Post vital signs: Reviewed and stable  Last Vitals:  Vitals Value Taken Time  BP 133/72 03/31/19 1046  Temp    Pulse 95 03/31/19 1049  Resp 15 03/31/19 1049  SpO2 100 % 03/31/19 1049  Vitals shown include unvalidated device data.  Last Pain:  Vitals:   03/31/19 0436  TempSrc: Oral  PainSc:       Patients Stated Pain Goal: 3 (17/00/17 4944)  Complications: No apparent anesthesia complications

## 2019-03-31 NOTE — Anesthesia Procedure Notes (Signed)
Procedure Name: Intubation Date/Time: 03/31/2019 7:42 AM Performed by: Orlie Dakin, CRNA Pre-anesthesia Checklist: Patient identified, Emergency Drugs available, Patient being monitored and Suction available Patient Re-evaluated:Patient Re-evaluated prior to induction Oxygen Delivery Method: Circle system utilized Preoxygenation: Pre-oxygenation with 100% oxygen Induction Type: IV induction Laryngoscope Size: Miller and 3 Grade View: Grade II Tube type: Oral Tube size: 7.0 mm Number of attempts: 1 Airway Equipment and Method: Stylet Placement Confirmation: ETT inserted through vocal cords under direct vision,  positive ETCO2 and breath sounds checked- equal and bilateral Secured at: 23 cm Tube secured with: Tape Dental Injury: Teeth and Oropharynx as per pre-operative assessment  Comments: Large tongue noted, minimal neck extension with DL.  4x4s bite block used for proc.

## 2019-03-31 NOTE — Progress Notes (Signed)
Patient taken to OR at 0630.Report given to OR staff/ anesthesia. No signs of any acute distress at the time of transfer.

## 2019-03-31 NOTE — Anesthesia Postprocedure Evaluation (Signed)
Anesthesia Post Note  Patient: Julie Hess  Procedure(s) Performed: LUMBAR THREE- LUMBAR FOUR POSTERIOR SPINAL FUSION WITH INSTRUMENTATION AND ALLOGRAFT (N/A )     Patient location during evaluation: PACU Anesthesia Type: General Level of consciousness: awake and alert Pain management: pain level controlled Vital Signs Assessment: post-procedure vital signs reviewed and stable Respiratory status: spontaneous breathing, nonlabored ventilation, respiratory function stable and patient connected to nasal cannula oxygen Cardiovascular status: blood pressure returned to baseline and stable Postop Assessment: no apparent nausea or vomiting Anesthetic complications: no    Last Vitals:  Vitals:   03/31/19 1117 03/31/19 1132  BP: 139/75 126/69  Pulse: 93 75  Resp: (!) 29 12  Temp:    SpO2: 100% 98%    Last Pain:  Vitals:   03/31/19 1117  TempSrc:   PainSc: 9                  Barnet Glasgow

## 2019-04-01 ENCOUNTER — Encounter (HOSPITAL_COMMUNITY): Payer: Self-pay | Admitting: Orthopedic Surgery

## 2019-04-01 MED ORDER — DIAZEPAM 5 MG PO TABS: 5.0000 mg | ORAL_TABLET | Freq: Four times a day (QID) | ORAL | 0 refills | Status: DC | PRN

## 2019-04-01 MED ORDER — HYDROCODONE-ACETAMINOPHEN 10-325 MG PO TABS: 1.0000 | ORAL_TABLET | ORAL | 0 refills | Status: AC | PRN

## 2019-04-01 NOTE — Evaluation (Signed)
Occupational Therapy Evaluation Patient Details Name: Julie Hess MRN: [id number] DOB: 12/29/1970 Today's Date: 04/01/2019    History of Present Illness Pt is a 48 y/o female who presents s/p L3-L4 PLIF on 03/31/2019. PMH significant for ulcerative colitis, hypothyroidism, HTN, asthna.   Clinical Impression   Patient evaluated by Occupational Therapy with no further acute OT needs identified. All education has been completed and the patient has no further questions. All education completed.  Pt knows how to acquire AE.   See below for any follow-up Occupational Therapy or equipment needs. OT is signing off. Thank you for this referral.      Follow Up Recommendations  No OT follow up    Equipment Recommendations  None recommended by OT    Recommendations for Other Services       Precautions / Restrictions Precautions Precautions: Fall;Back Precaution Booklet Issued: Yes (comment) Precaution Comments: Pt able to recall 3/3 back precautions  Required Braces or Orthoses: Spinal Brace Spinal Brace: Thoracolumbosacral orthotic;Applied in sitting position Restrictions Weight Bearing Restrictions: No      Mobility Bed Mobility               General bed mobility comments: Pt sitting in chair   Transfers Overall transfer level: Modified independent Equipment used: None                  Balance Overall balance assessment: Mild deficits observed, not formally tested                                         ADL either performed or assessed with clinical judgement   ADL Overall ADL's : Needs assistance/impaired Eating/Feeding: Independent   Grooming: Wash/dry hands;Wash/dry face;Oral care;Brushing hair;Supervision/safety;Standing Grooming Details (indicate cue type and reason): Pt able to verbalize understanding of technique for brushing teeth  Upper Body Bathing: Set up;Sitting   Lower Body Bathing: Moderate assistance;Sit to/from  stand Lower Body Bathing Details (indicate cue type and reason): Pt reports she has a LH sponge  Upper Body Dressing : Set up;Sitting   Lower Body Dressing: Moderate assistance;Sit to/from stand Lower Body Dressing Details (indicate cue type and reason): unable to perform figure 4.  Pt instructed in use of sock aid, and reacher for LB ADLs and able to return demonstration.  She reports she will order them  Toilet Transfer: Modified Independent;Ambulation;Comfort height toilet;RW   Toileting- Clothing Manipulation and Hygiene: Moderate assistance;Sit to/from stand Toileting - Clothing Manipulation Details (indicate cue type and reason): reviewed use and acquisition of toileting aids and options for aids.  She verbalized understanding      Functional mobility during ADLs: Modified independent;Rolling walker General ADL Comments: reviewed safety with IADLs      Vision Baseline Vision/History: Wears glasses Wears Glasses: At all times Patient Visual Report: No change from baseline       Perception     Praxis      Pertinent Vitals/Pain Pain Assessment: Faces Faces Pain Scale: Hurts little more Pain Location: Incision site Pain Descriptors / Indicators: Operative site guarding;Grimacing Pain Intervention(s): Limited activity within patient's tolerance;Monitored during session;Repositioned     Hand Dominance Right   Extremity/Trunk Assessment Upper Extremity Assessment Upper Extremity Assessment: Overall WFL for tasks assessed   Lower Extremity Assessment Lower Extremity Assessment: Defer to PT evaluation LLE Deficits / Details: Decreased strength and muscular endurance consistent with pre-op diagnosis   Cervical /  Trunk Assessment Cervical / Trunk Assessment: Other exceptions Cervical / Trunk Exceptions: s/p surgery   Communication Communication Communication: No difficulties   Cognition Arousal/Alertness: Awake/alert Behavior During Therapy: WFL for tasks  assessed/performed Overall Cognitive Status: Within Functional Limits for tasks assessed                                     General Comments       Exercises     Shoulder Instructions      Home Living Family/patient expects to be discharged to:: Private residence Living Arrangements: Spouse/significant other Available Help at Discharge: Family;Available 24 hours/day Type of Home: House Home Access: Stairs to enter [employer] of Steps: 3 Entrance Stairs-Rails: Right;Left Home Layout: Two level Alternate Level Stairs-Number of Steps: 10 Alternate Level Stairs-Rails: Right     Bathroom Toilet: Handicapped height                Prior Functioning/Environment Level of Independence: Needs assistance        Comments: Grossly pt has been functioning independently however husband has been assisting at home with IADL's. They "team cook" and he has been assisting with lifting and caring for their 3 dogs.         OT Problem List: Decreased activity tolerance;Decreased knowledge of use of DME or AE;Decreased knowledge of precautions;Obesity;Pain      OT Treatment/Interventions:      OT Goals(Current goals can be found in the care plan section) Acute Rehab OT Goals Patient Stated Goal: to feel better and go back to work eventually  OT Goal Formulation: All assessment and education complete, DC therapy  OT Frequency:     Barriers to D/C:            Co-evaluation              AM-PAC OT "6 Clicks" Daily Activity     Outcome Measure Help from another person eating meals?: None Help from another person taking care of personal grooming?: None Help from another person toileting, which includes using toliet, bedpan, or urinal?: A Little Help from another person bathing (including washing, rinsing, drying)?: A Little Help from another person to put on and taking off regular upper body clothing?: None Help from another person to put on and  taking off regular lower body clothing?: A Little 6 Click Score: 21   End of Session Equipment Utilized During Treatment: Back brace Nurse Communication: Mobility status  Activity Tolerance: Patient tolerated treatment well Patient left: in chair;with call bell/phone within reach  OT Visit Diagnosis: Pain Pain - part of body: (back )                Time: 6644-0347 OT Time Calculation (min): 40 min Charges:  OT General Charges $OT Visit: 1 Visit OT Evaluation $OT Eval Moderate Complexity: 1 Mod OT Treatments $Self Care/Home Management : 23-37 mins  Julie Hess, OTR/L Acute Rehabilitation Services Pager [phone] Office [phone]   Julie Hess M 04/01/2019, 10:23 AM

## 2019-04-01 NOTE — Progress Notes (Signed)
Pt and husband given D/C instructions with verbal understanding. Rx's were sent to pharmacy by MD. Pt's incisions are clean and dry with no sign of infection. Pt's IV was removed prior to D/C. Pt D/C'd home via wheelchair @ 1120 per MD order. Pt is stable @ D/C and has no other needs at this time. Holli Humbles, RN

## 2019-04-01 NOTE — Evaluation (Signed)
Physical Therapy Evaluation and Discharge Patient Details Name: Julie PenRebekah Hess MRN: [id number] DOB: 01/20/1971 Today's Date: 04/01/2019   History of Present Illness  Pt is a 48 y/o female who presents s/p L3-L4 PLIF on 03/31/2019. PMH significant for ulcerative colitis, hypothyroidism, HTN, asthna.  Clinical Impression  Patient evaluated by Physical Therapy with no further acute PT needs identified. All education has been completed and the patient has no further questions. At the time of PT eval pt was able to perform transfers and ambulation with gross modified independence without an AD. PA present at beginning of session and adjusted brace for optimal fit. Pt was educated on brace application and wearing schedule, precautions, activity progression, and car transfer. See below for any follow-up Physical Therapy or equipment needs. PT is signing off. Thank you for this referral.     Follow Up Recommendations No PT follow up;Supervision - Intermittent    Equipment Recommendations  None recommended by PT    Recommendations for Other Services       Precautions / Restrictions Precautions Precautions: Fall;Back Precaution Booklet Issued: Yes (comment) Precaution Comments: Pt able to recall 3/3 back precautions  Required Braces or Orthoses: Spinal Brace Spinal Brace: Thoracolumbosacral orthotic;Applied in sitting position Restrictions Weight Bearing Restrictions: No      Mobility  Bed Mobility               General bed mobility comments: Pt sitting in chair   Transfers Overall transfer level: Modified independent Equipment used: None             General transfer comment: No assist required. Pt demonstrating good posture with sit<>stand.   Ambulation/Gait Ambulation/Gait assistance: Modified independent (Device/Increase time) Gait Distance (Feet): 400 Feet Assistive device: None Gait Pattern/deviations: Step-through pattern;Decreased stride length Gait velocity:  Slightly decreased Gait velocity interpretation: <1.8 ft/sec, indicate of risk for recurrent falls General Gait Details: Pt demonstrating good posture and maintenance of precautions during gait training. No assist required and no overt LOB noted.   Stairs Stairs: Yes Stairs assistance: Modified independent (Device/Increase time) Stair Management: One rail Right;Alternating pattern;Step to pattern;Forwards Number of Stairs: 10 General stair comments: Occasional step-to but grossly alternating step pattern.   Wheelchair Mobility    Modified Rankin (Stroke Patients Only)       Balance Overall balance assessment: Mild deficits observed, not formally tested                                           Pertinent Vitals/Pain Pain Assessment: Faces Faces Pain Scale: Hurts little more Pain Location: Incision site Pain Descriptors / Indicators: Operative site guarding;Grimacing Pain Intervention(s): Limited activity within patient's tolerance;Monitored during session;Repositioned    Home Living Family/patient expects to be discharged to:: Private residence Living Arrangements: Spouse/significant other Available Help at Discharge: Family;Available 24 hours/day Type of Home: House Home Access: Stairs to enter Entrance Stairs-Rails: Doctor, general practiceight;Left Entrance Stairs-Number of Steps: 3 Home Layout: Two level        Prior Function Level of Independence: Needs assistance         Comments: Grossly pt has been functioning independently however husband has been assisting at home with IADL's. They "team cook" and he has been assisting with lifting and caring for their 3 dogs.      Hand Dominance   Dominant Hand: Right    Extremity/Trunk Assessment   Upper Extremity Assessment Upper Extremity Assessment:  Overall WFL for tasks assessed    Lower Extremity Assessment Lower Extremity Assessment: Defer to PT evaluation LLE Deficits / Details: Decreased strength and  muscular endurance consistent with pre-op diagnosis    Cervical / Trunk Assessment Cervical / Trunk Assessment: Other exceptions Cervical / Trunk Exceptions: s/p surgery  Communication   Communication: No difficulties  Cognition Arousal/Alertness: Awake/alert Behavior During Therapy: WFL for tasks assessed/performed Overall Cognitive Status: Within Functional Limits for tasks assessed                                        General Comments      Exercises     Assessment/Plan    PT Assessment Patent does not need any further PT services  PT Problem List         PT Treatment Interventions      PT Goals (Current goals can be found in the Care Plan section)  Acute Rehab PT Goals Patient Stated Goal: to feel better and go back to work eventually  PT Goal Formulation: All assessment and education complete, DC therapy    Frequency     Barriers to discharge        Co-evaluation               AM-PAC PT "6 Clicks" Mobility  Outcome Measure Help needed turning from your back to your side while in a flat bed without using bedrails?: None Help needed moving from lying on your back to sitting on the side of a flat bed without using bedrails?: None Help needed moving to and from a bed to a chair (including a wheelchair)?: None Help needed standing up from a chair using your arms (e.g., wheelchair or bedside chair)?: None Help needed to walk in hospital room?: None Help needed climbing 3-5 steps with a railing? : None 6 Click Score: 24    End of Session Equipment Utilized During Treatment: Back brace Activity Tolerance: Patient tolerated treatment well Patient left: with call bell/phone within reach;Other (comment)(Sitting EOB) Nurse Communication: Mobility status PT Visit Diagnosis: Pain    Time: 4627-0350 PT Time Calculation (min) (ACUTE ONLY): 18 min   Charges:   PT Evaluation $PT Eval Low Complexity: 1 Low          Rolinda Roan, PT,  DPT Acute Rehabilitation Services Pager: [phone] Office: [phone]   Thelma Comp 04/01/2019, 10:57 AM

## 2019-04-01 NOTE — Progress Notes (Signed)
    Patient doing well PO day 1&2 after LAT/POST fusion. She has been up and walking and reports minimal well controlled pain. Eager to progress home    Physical Exam: Vitals:   04/01/19 0415 04/01/19 0728  BP: 133/76 (!) 132/47  Pulse: 76 70  Resp: 20 18  Temp: 98 F (36.7 C) 97.7 F (36.5 C)  SpO2: 99% 100%    Dressing in place, CDI, TLSO brace adjusted and worn appropriately  NVI  POD #1&2 s/p LAT/POST fusion doing excellent  - up with PT/OT, encourage ambulation - Norco for pain, Valium for muscle spasms - d/c home today with f/u in 2 weeks - TLSO brace at all times when OOB

## 2019-04-04 MED FILL — Sodium Chloride IV Soln 0.9%: INTRAVENOUS | Qty: 1000 | Status: AC

## 2019-04-04 MED FILL — Heparin Sodium (Porcine) Inj 1000 Unit/ML: INTRAMUSCULAR | Qty: 30 | Status: AC

## 2019-04-06 NOTE — Discharge Summary (Signed)
Patient ID: Julie Hess MRN: [id number] DOB/AGE: 04/11/1971 48 y.o.  Admit date: 03/30/2019 Discharge date: 04/01/2019  Admission Diagnoses:  Active Problems:   Radiculopathy   Discharge Diagnoses:  Same  Past Medical History:  Diagnosis Date  . Anemia    with ulcerative colitis  . Anxiety   . Asthma    very mild per pt  . Complication of anesthesia    Hard time waking up after C-Section 1997, but tolerated anesthesia since then no issues  . Dyspnea    with activity  . GERD (gastroesophageal reflux disease)   . Hyperlipidemia   . Hypertension   . Hypothyroidism   . Migraines    migraines  . Sleep apnea    uses cpap  . Thyroid disease   . Ulcerative colitis, chronic (HCC)     Surgeries: Procedure(s): LUMBAR THREE- LUMBAR FOUR POSTERIOR SPINAL FUSION WITH INSTRUMENTATION AND ALLOGRAFT on 03/31/2019   Consultants: None  Discharged Condition: Improved  Hospital Course: Julie Hess is an 48 y.o. female who was admitted 03/30/2019 for operative treatment of radiculopathy. Patient has severe unremitting pain that affects sleep, daily activities, and work/hobbies. After pre-op clearance the patient was taken to the operating room on 03/31/2019 and underwent  Procedure(s): LUMBAR THREE- LUMBAR FOUR POSTERIOR SPINAL FUSION WITH INSTRUMENTATION AND ALLOGRAFT.    Patient was given perioperative antibiotics:  Anti-infectives (From admission, onward)   Start     Dose/Rate Route Frequency Ordered Stop   03/31/19 0745  vancomycin (VANCOCIN) 1,500 mg in sodium chloride 0.9 % 500 mL IVPB  Status:  Discontinued     1,500 mg 250 mL/hr over 120 Minutes Intravenous To Surgery 03/31/19 0730 03/31/19 1220   03/30/19 1800  vancomycin (VANCOCIN) 1,250 mg in sodium chloride 0.9 % 250 mL IVPB     1,250 mg 166.7 mL/hr over 90 Minutes Intravenous  Once 03/30/19 1408 03/30/19 1844   03/30/19 0600  vancomycin (VANCOCIN) 1,500 mg in sodium chloride 0.9 % 500 mL IVPB     1,500 mg  250 mL/hr over 120 Minutes Intravenous On call to O.R. 03/29/19 3790 03/30/19 0948       Patient was given sequential compression devices, early ambulation to prevent DVT.  Patient benefited maximally from hospital stay and there were no complications.    Recent vital signs: BP (!) 132/47 (BP Location: Right Arm)   Pulse 70   Temp 97.7 F (36.5 C) (Oral)   Resp 18   SpO2 100%    Discharge Medications:   Allergies as of 04/01/2019      Reactions   Penicillins Hives, Shortness Of Breath, Other (See Comments)   DID THE REACTION INVOLVE: Swelling of the face/tongue/throat, SOB, or low BP? #  #  #  YES  #  #  #  Sudden or severe rash/hives, skin peeling, or the inside of the mouth or nose? No Did it require medical treatment? No When did it last happen?2016   Other Other (See Comments)   Anti-depressants   Oxycodone    Prefers Hydrocodone - Oxycodone causes nausea and dizziness      Medication List    TAKE these medications   busPIRone 10 MG tablet Commonly known as: BUSPAR Take 10 mg by mouth 2 (two) times daily.   diazepam 5 MG tablet Commonly known as: VALIUM Take 1 tablet (5 mg total) by mouth every 6 (six) hours as needed for muscle spasms.   estradiol 0.05 MG/24HR patch Commonly known as: VIVELLE-DOT Place 1  patch onto the skin 2 (two) times a week.   gabapentin 600 MG tablet Commonly known as: NEURONTIN Take 600 mg by mouth 3 (three) times daily.   HYDROcodone-acetaminophen 10-325 MG tablet Commonly known as: NORCO Take 1-2 tablets by mouth every 4 (four) hours as needed for moderate pain or severe pain.   levothyroxine 75 MCG tablet Commonly known as: SYNTHROID Take 75 mcg by mouth daily before breakfast.   losartan 50 MG tablet Commonly known as: COZAAR Take 50 mg by mouth at bedtime.   pantoprazole 40 MG tablet Commonly known as: PROTONIX Take 40 mg by mouth daily.   pravastatin 40 MG tablet Commonly known as: PRAVACHOL Take 40 mg by mouth at  bedtime.   vitamin C 500 MG tablet Commonly known as: ASCORBIC ACID Take 500 mg by mouth daily.       Diagnostic Studies: Dg Lumbar Spine 2-3 Views  Result Date: 03/31/2019 CLINICAL DATA:  Posterior lumbar fusion EXAM: LUMBAR SPINE - 2-3 VIEW; DG C-ARM 61-120 MIN COMPARISON:  03/30/2019 FINDINGS: Extension of the posterior fusion noted to what appears to be the L3 level. No visible hardware bony complicating feature on these intraoperative spot images. IMPRESSION: Posterior lumbar fusion extension. Electronically Signed   By: Charlett NoseKevin  Dover M.D.   On: 03/31/2019 11:25   Dg Lumbar Spine 2-3 Views  Result Date: 03/30/2019 CLINICAL DATA:  L3-4 lateral fusion EXAM: DG C-ARM 61-120 MIN; LUMBAR SPINE - 2-3 VIEW COMPARISON:  09/30/2017 FINDINGS: Remote changes of posterior fusion noted from L4-S1. Disc spacer noted at L3-4. No visible complicating feature. IMPRESSION: Intraoperative imaging as above. Electronically Signed   By: Charlett NoseKevin  Dover M.D.   On: 03/30/2019 13:47   Dg Lumbar Spine 1 View  Result Date: 03/31/2019 CLINICAL DATA:  Intraoperative localization EXAM: LUMBAR SPINE - 1 VIEW COMPARISON:  09/30/2017 FINDINGS: Posterior needles are directed at the L1 vertebral body and the L2-3 disc space. Prior posterior fusion from L4 to S1 IMPRESSION: Intraoperative localization as above. Electronically Signed   By: Charlett NoseKevin  Dover M.D.   On: 03/31/2019 11:28   Dg C-arm 1-60 Min  Result Date: 03/31/2019 CLINICAL DATA:  Posterior lumbar fusion EXAM: LUMBAR SPINE - 2-3 VIEW; DG C-ARM 61-120 MIN COMPARISON:  03/30/2019 FINDINGS: Extension of the posterior fusion noted to what appears to be the L3 level. No visible hardware bony complicating feature on these intraoperative spot images. IMPRESSION: Posterior lumbar fusion extension. Electronically Signed   By: Charlett NoseKevin  Dover M.D.   On: 03/31/2019 11:25   Dg C-arm 1-60 Min  Result Date: 03/30/2019 CLINICAL DATA:  L3-4 lateral fusion EXAM: DG C-ARM 61-120 MIN;  LUMBAR SPINE - 2-3 VIEW COMPARISON:  09/30/2017 FINDINGS: Remote changes of posterior fusion noted from L4-S1. Disc spacer noted at L3-4. No visible complicating feature. IMPRESSION: Intraoperative imaging as above. Electronically Signed   By: Charlett NoseKevin  Dover M.D.   On: 03/30/2019 13:47    Disposition: Discharge disposition: 01-Home or Self Care       Discharge Instructions    Discharge patient   Complete by: As directed    Discharge disposition: 01-Home or Self Care   Discharge patient date: 04/01/2019     POD #1&2 s/p LAT/POST fusion doing excellent  - up with PT/OT, encourage ambulation - Norco for pain, Valium for muscle spasms - d/c home today with f/u in 2 weeks - TLSO brace at all times when OOB   Signed: Eilene GhaziKayla J Mateo Overbeck 04/06/2019, 9:12 AM

## 2020-08-18 IMAGING — RF DG CERVICAL SPINE 1V
1 series · 5 of 5 positions shown · non-contrast
Comparison: None.

CLINICAL DATA: Anterior cervical fusion at the C4-5, C5-6 and C6-7
levels.

EXAM:
DG C-ARM 61-120 MIN; DG CERVICAL SPINE - 1 VIEW

[Series 1: run · 5 of 5 slices shown]
[im 1/5]
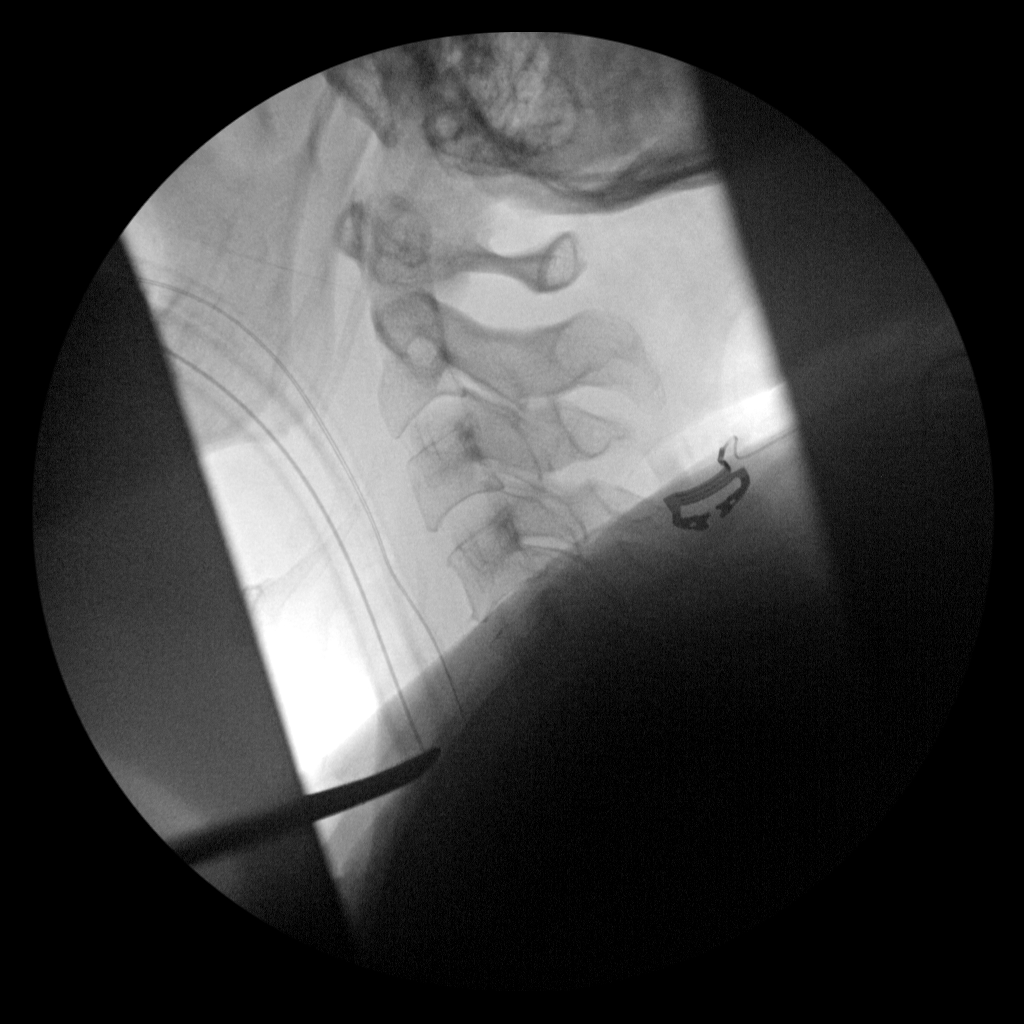
[im 2/5]
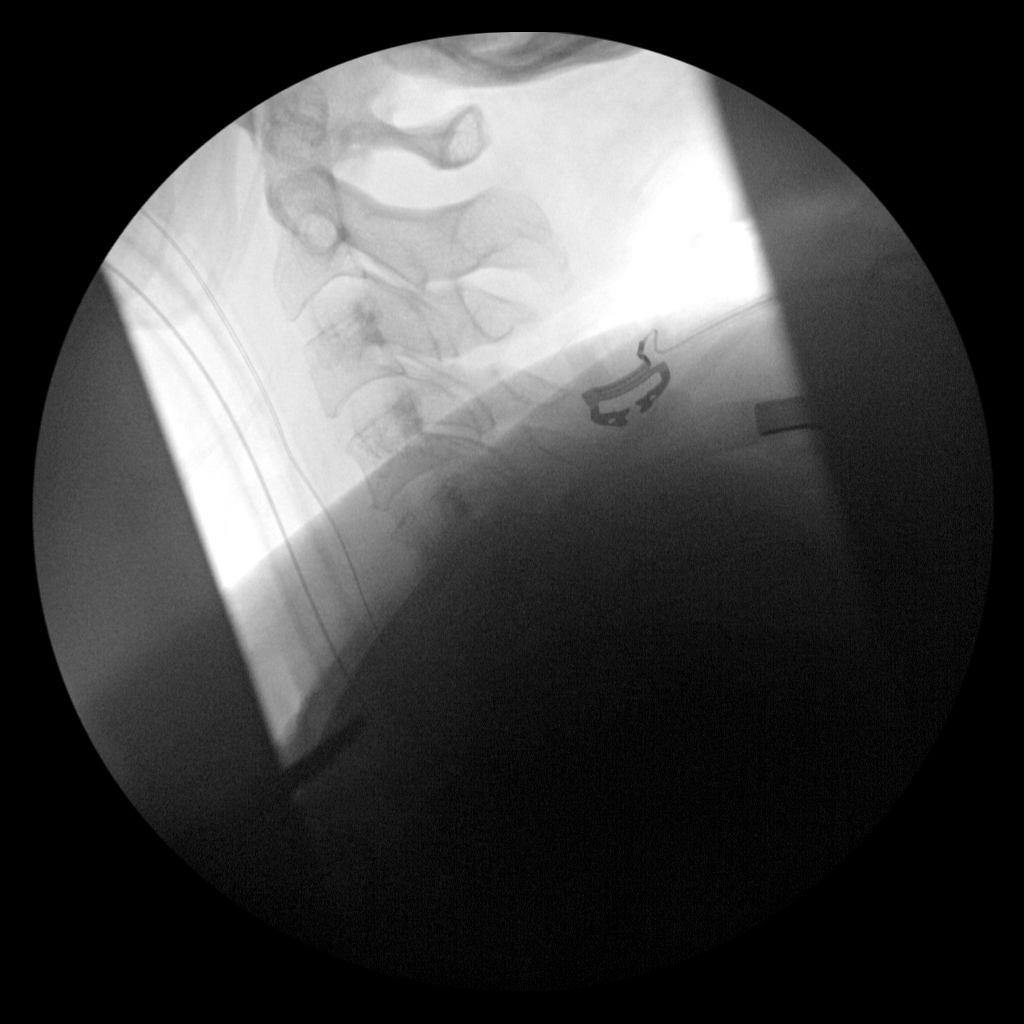
[im 3/5]
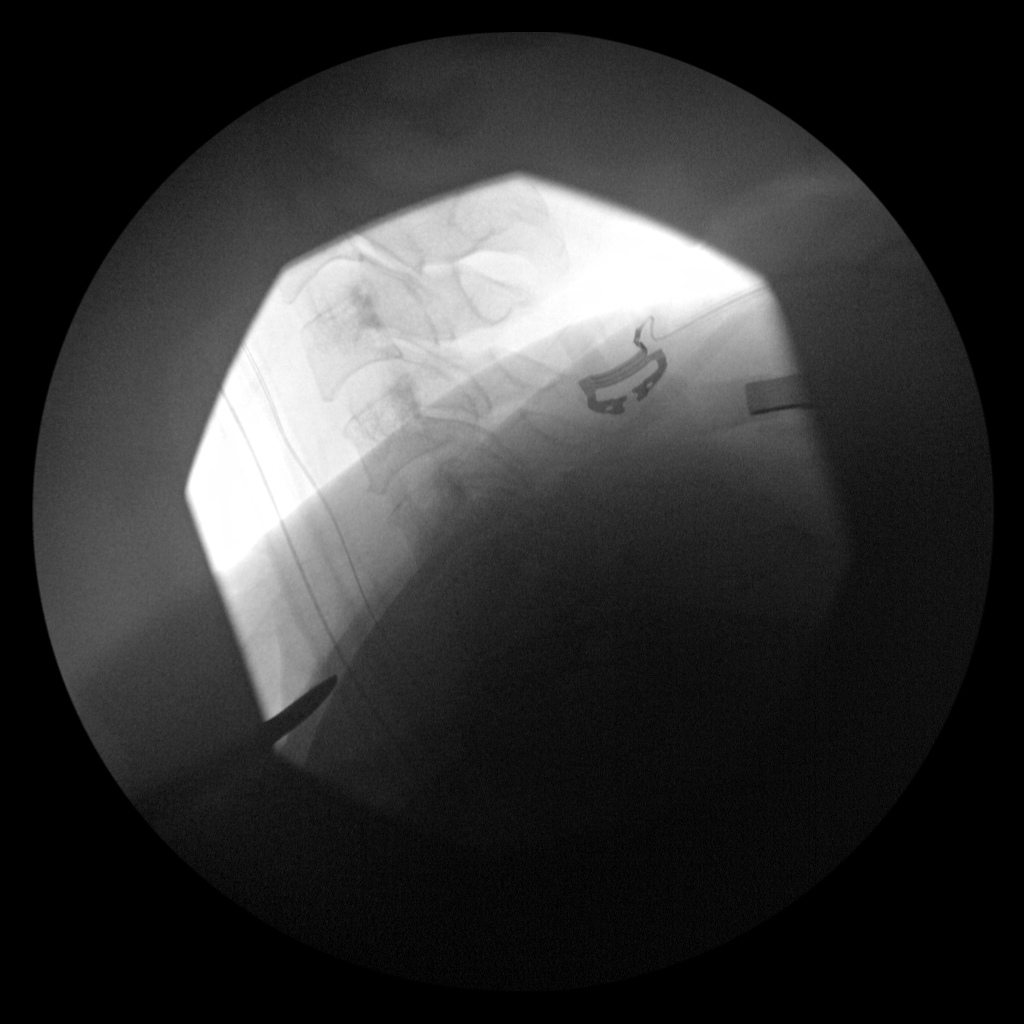
[im 4/5]
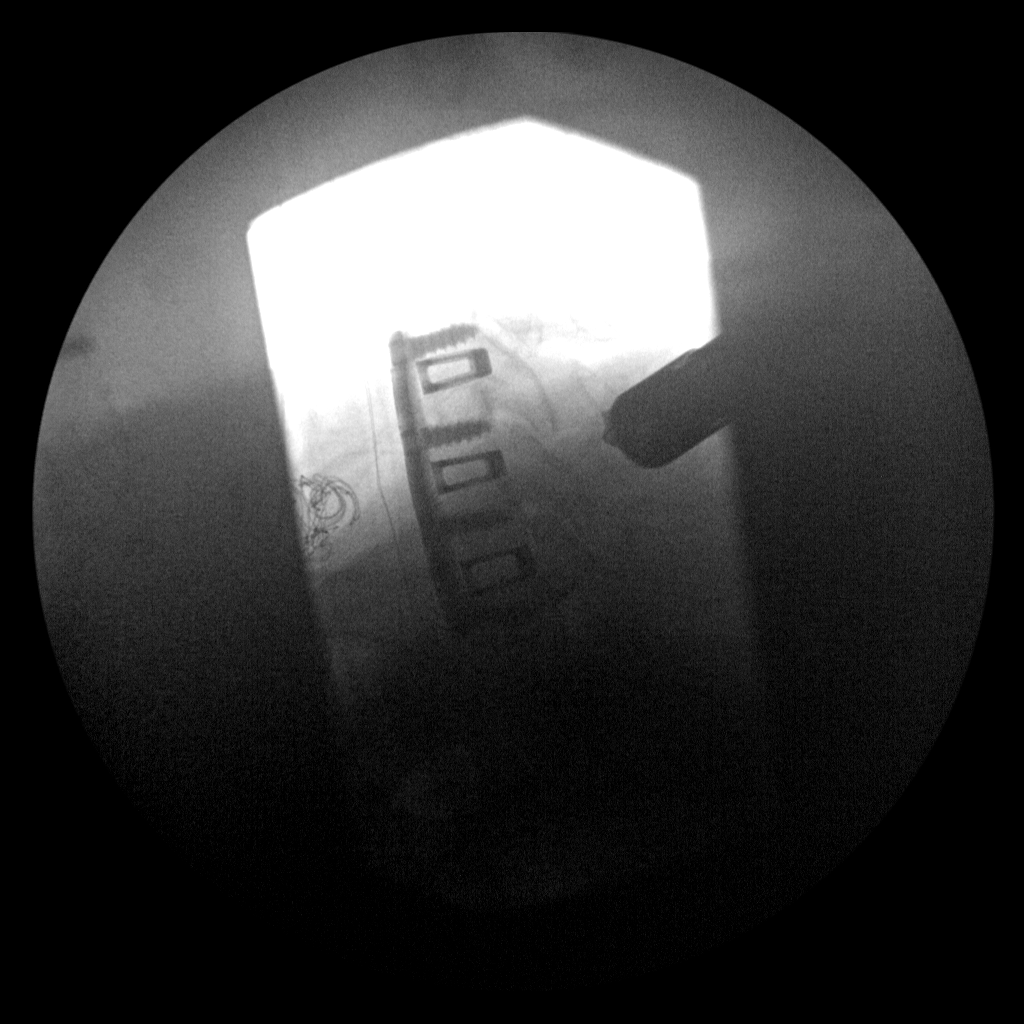
[im 5/5]
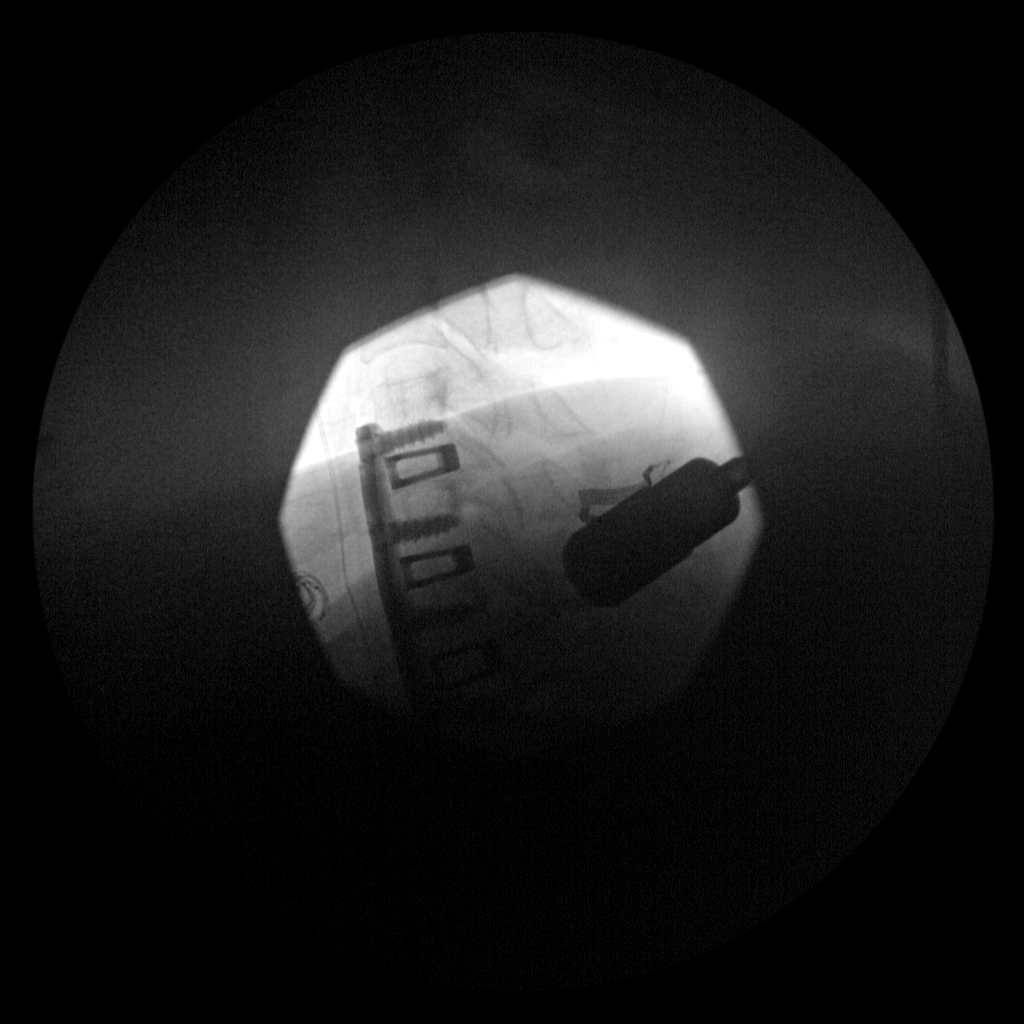

[5 of 5 positions shown; findings below may reference images not displayed]

FINDINGS: Five lateral C-arm views of the cervical spine demonstrate minimal
anterior spur formation at the C3-4 and C4-5 levels. The inferior
levels are not visualized due to overlapping of the patient's
shoulders. An anterior metallic localizer is overlying the anterior
shoulders on the 1st 3 images. The last 2 images demonstrate
interbody and anterior screw and plate fusion 3 inferior levels. The
levels and alignment cannot be determined on these images.
IMPRESSION: Anterior cervical fusion, as described above.

## 2021-03-01 DIAGNOSIS — R748 Abnormal levels of other serum enzymes: Secondary | ICD-10-CM | POA: Insufficient documentation

## 2021-03-01 DIAGNOSIS — F4310 Post-traumatic stress disorder, unspecified: Secondary | ICD-10-CM | POA: Insufficient documentation

## 2021-03-10 DIAGNOSIS — E661 Drug-induced obesity: Secondary | ICD-10-CM | POA: Insufficient documentation

## 2021-08-11 DIAGNOSIS — I639 Cerebral infarction, unspecified: Secondary | ICD-10-CM

## 2021-08-11 HISTORY — DX: Cerebral infarction, unspecified: I63.9

## 2021-08-17 ENCOUNTER — Encounter (HOSPITAL_COMMUNITY): Payer: Self-pay | Admitting: Emergency Medicine

## 2021-08-17 ENCOUNTER — Other Ambulatory Visit: Payer: Self-pay

## 2021-08-17 ENCOUNTER — Emergency Department (HOSPITAL_COMMUNITY): Payer: BC Managed Care – PPO

## 2021-08-17 ENCOUNTER — Emergency Department (HOSPITAL_COMMUNITY)
Admission: EM | Admit: 2021-08-17 | Discharge: 2021-08-17 | Disposition: A | Discharge status: Home / Self Care | Payer: BC Managed Care – PPO | Attending: Emergency Medicine | Admitting: Emergency Medicine

## 2021-08-17 DIAGNOSIS — R0602 Shortness of breath: Secondary | ICD-10-CM | POA: Insufficient documentation

## 2021-08-17 DIAGNOSIS — Z79899 Other long term (current) drug therapy: Secondary | ICD-10-CM | POA: Diagnosis not present

## 2021-08-17 DIAGNOSIS — Z20822 Contact with and (suspected) exposure to covid-19: Secondary | ICD-10-CM | POA: Insufficient documentation

## 2021-08-17 DIAGNOSIS — J40 Bronchitis, not specified as acute or chronic: Secondary | ICD-10-CM

## 2021-08-17 DIAGNOSIS — R059 Cough, unspecified: Secondary | ICD-10-CM | POA: Diagnosis not present

## 2021-08-17 LAB — CBC WITH DIFFERENTIAL/PLATELET
Abs Immature Granulocytes: 0.18 10*3/uL — ABNORMAL HIGH (ref 0.00–0.07)
Basophils Absolute: 0.1 10*3/uL (ref 0.0–0.1)
Basophils Relative: 0 %
Eosinophils Absolute: 0 10*3/uL (ref 0.0–0.5)
Eosinophils Relative: 0 %
HCT: 43 % (ref 36.0–46.0)
Hemoglobin: 14.1 g/dL (ref 12.0–15.0)
Immature Granulocytes: 1 %
Lymphocytes Relative: 19 %
Lymphs Abs: 2.6 10*3/uL (ref 0.7–4.0)
MCH: 29.6 pg (ref 26.0–34.0)
MCHC: 32.8 g/dL (ref 30.0–36.0)
MCV: 90.3 fL (ref 80.0–100.0)
Monocytes Absolute: 0.4 10*3/uL (ref 0.1–1.0)
Monocytes Relative: 3 %
Neutro Abs: 10.3 10*3/uL — ABNORMAL HIGH (ref 1.7–7.7)
Neutrophils Relative %: 77 %
Platelets: 355 10*3/uL (ref 150–400)
RBC: 4.76 MIL/uL (ref 3.87–5.11)
RDW: 14.5 % (ref 11.5–15.5)
WBC: 13.4 10*3/uL — ABNORMAL HIGH (ref 4.0–10.5)
nRBC: 0.1 % (ref 0.0–0.2)

## 2021-08-17 LAB — COMPREHENSIVE METABOLIC PANEL
ALT: 19 U/L (ref 0–44)
AST: 20 U/L (ref 15–41)
Albumin: 3.8 g/dL (ref 3.5–5.0)
Alkaline Phosphatase: 93 U/L (ref 38–126)
Anion gap: 11 (ref 5–15)
BUN: 11 mg/dL (ref 6–20)
CO2: 23 mmol/L (ref 22–32)
Calcium: 8.8 mg/dL — ABNORMAL LOW (ref 8.9–10.3)
Chloride: 105 mmol/L (ref 98–111)
Creatinine, Ser: 0.9 mg/dL (ref 0.44–1.00)
GFR, Estimated: >60 mL/min (ref >60)
Glucose, Bld: 134 mg/dL — ABNORMAL HIGH (ref 70–99)
Potassium: 3.8 mmol/L (ref 3.5–5.1)
Sodium: 139 mmol/L (ref 135–145)
Total Bilirubin: 0.4 mg/dL (ref 0.3–1.2)
Total Protein: 7 g/dL (ref 6.5–8.1)

## 2021-08-17 LAB — RESP PANEL BY RT-PCR (FLU A&B, COVID) ARPGX2
Influenza A by PCR: NEGATIVE
Influenza B by PCR: NEGATIVE
SARS Coronavirus 2 by RT PCR: NEGATIVE

## 2021-08-17 LAB — TROPONIN I (HIGH SENSITIVITY)
Troponin I (High Sensitivity): 3 ng/L (ref <18)
Troponin I (High Sensitivity): 3 ng/L (ref <18)

## 2021-08-17 MED ORDER — MAGNESIUM SULFATE 2 GM/50ML IV SOLN
2.0000 g | Freq: Once | INTRAVENOUS | Status: AC
  Administered 2021-08-17: 2 g via INTRAVENOUS
  Filled 2021-08-17: qty 50

## 2021-08-17 MED ORDER — BENZONATATE 100 MG PO CAPS
100.0000 mg | ORAL_CAPSULE | Freq: Once | ORAL | Status: AC
  Administered 2021-08-17: 100 mg via ORAL
  Filled 2021-08-17: qty 1

## 2021-08-17 MED ORDER — SODIUM CHLORIDE 0.9 % IV BOLUS
1000.0000 mL | Freq: Once | INTRAVENOUS | Status: AC
Start: 2021-08-17 — End: 2021-08-17
  Administered 2021-08-17: 1000 mL via INTRAVENOUS

## 2021-08-17 MED ORDER — IPRATROPIUM BROMIDE 0.02 % IN SOLN
0.5000 mg | Freq: Once | RESPIRATORY_TRACT | Status: AC
  Administered 2021-08-17: 0.5 mg via RESPIRATORY_TRACT
  Filled 2021-08-17: qty 2.5

## 2021-08-17 MED ORDER — BENZONATATE 100 MG PO CAPS: 100.0000 mg | ORAL_CAPSULE | Freq: Three times a day (TID) | ORAL | 0 refills | Status: DC

## 2021-08-17 MED ORDER — IOHEXOL 350 MG/ML SOLN
61.0000 mL | Freq: Once | INTRAVENOUS | Status: AC | PRN
  Administered 2021-08-17: 61 mL via INTRAVENOUS

## 2021-08-17 MED ORDER — PREDNISONE 20 MG PO TABS: ORAL_TABLET | ORAL | 0 refills | Status: DC

## 2021-08-17 MED ORDER — METHYLPREDNISOLONE SODIUM SUCC 125 MG IJ SOLR
125.0000 mg | Freq: Once | INTRAMUSCULAR | Status: AC
  Administered 2021-08-17: 125 mg via INTRAVENOUS
  Filled 2021-08-17: qty 2

## 2021-08-17 MED ORDER — ALBUTEROL SULFATE (2.5 MG/3ML) 0.083% IN NEBU
5.0000 mg | INHALATION_SOLUTION | Freq: Once | RESPIRATORY_TRACT | Status: AC
Start: 2021-08-17 — End: 2021-08-17
  Administered 2021-08-17: 5 mg via RESPIRATORY_TRACT
  Filled 2021-08-17: qty 6

## 2021-08-17 NOTE — ED Provider Triage Note (Signed)
Emergency Medicine Provider Triage Evaluation Note  Julie Hess , a 51 y.o. female  was evaluated in triage.  Pt complains of cough, congestion.  She states that her symptoms have been ongoing for the past week.  She was seen at urgent care Thursday who gave her azithromycin, prednisone, and an albuterol inhaler.  She has been trying all 3 of these things without any relief of her symptoms.  She called her primary care today and left a message.  Was told by urgent care to go to the ER if she had any worsening symptoms and she states that her cough is getting worse.  Of note, she states she has had pneumonia twice since October.  She does endorse some chest pain, describes it as a burning sensation which is only present when she coughs.  Review of Systems  Positive:  Negative: See above  Physical Exam  BP 134/84 (BP Location: Right Arm)    Pulse (!) 106    Temp (!) 97.5 F (36.4 C)    Resp 17    SpO2 97%  Gen:   Awake, no distress   Resp:  Normal effort, coughing consistently in triage MSK:   Moves extremities without difficulty  Other:    Medical Decision Making  Medically screening exam initiated at 3:19 PM.  Appropriate orders placed.  Chantea Surace was informed that the remainder of the evaluation will be completed by another provider, this initial triage assessment does not replace that evaluation, and the importance of remaining in the ED until their evaluation is complete.     Silva Bandy, PA-C 08/17/21 1521

## 2021-08-17 NOTE — ED Triage Notes (Signed)
Pt reports 3 rounds on PNA since September. Pt seen a UC Thursday and given medications. Pt unable to get to her PCP and UC stated to come to ER if symptoms worsen. Endorses chest pain from coughing so much.

## 2021-08-17 NOTE — ED Provider Notes (Signed)
[facility]PART[facility] Provider Note   CSN: [id number] Arrival date & time: 08/17/21  1414     History  Chief Complaint  Patient presents with   Hemoptysis    Julie Hess is a 51 y.o. female history of ulcerative colitis, here presenting with cough and shortness of breath.  Patient states that she had pneumonia twice in October and finished 2 courses of antibiotics.  She states that beginning of December she was diagnosed with pneumonia again and finished another course of antibiotics.  She went to urgent care 3 days ago and was thought to have pneumonia and was prescribed Z-Pak and steroids.  She states that she has persistent coughing.  She states that she has some blood-tinged sputum.  She was sent in to get a CT scan to make sure she has no underlying lung problems or blood clots.  Patient also was referred to pulmonology outpatient.  Patient tested negative for COVID and flu several days ago  The history is provided by the patient.      Home Medications Prior to Admission medications   Medication Sig Start Date End Date Taking? Authorizing Provider  busPIRone (BUSPAR) 10 MG tablet Take 10 mg by mouth 2 (two) times daily.     [provider]  diazepam (VALIUM) 5 MG tablet Take 1 tablet (5 mg total) by mouth every 6 (six) hours as needed for muscle spasms. 04/01/19   McKenzie, Lennie Muckle, PA-C  estradiol (VIVELLE-DOT) 0.05 MG/24HR patch Place 1 patch onto the skin 2 (two) times a week.    [provider]  gabapentin (NEURONTIN) 600 MG tablet Take 600 mg by mouth 3 (three) times daily.    [provider]  HYDROcodone-acetaminophen (NORCO) 10-325 MG tablet Take 1-2 tablets by mouth every 4 (four) hours as needed for moderate pain or severe pain. 04/01/19   McKenzie, Lennie Muckle, PA-C  levothyroxine (SYNTHROID, LEVOTHROID) 75 MCG tablet Take 75 mcg by mouth daily before breakfast.  07/07/15   [provider]  losartan (COZAAR)  50 MG tablet Take 50 mg by mouth at bedtime.    [provider]  pantoprazole (PROTONIX) 40 MG tablet Take 40 mg by mouth daily.    [provider]  pravastatin (PRAVACHOL) 40 MG tablet Take 40 mg by mouth at bedtime.    [provider]  vitamin C (ASCORBIC ACID) 500 MG tablet Take 500 mg by mouth daily.    [provider]      Allergies    Penicillins, Other, and Oxycodone    Review of Systems   Review of Systems  All other systems reviewed and are negative.  Physical Exam Updated Vital Signs BP (!) 114/55    Pulse 79    Temp (!) 97.5 F (36.4 C)    Resp (!) 22    Ht 5\' 5"  (1.651 m)    Wt (!) 138 kg    SpO2 100%    BMI 50.63 kg/m  Physical Exam Vitals and nursing note reviewed.  Constitutional:      Comments: Coughing, slightly uncomfortable  HENT:     Head: Normocephalic.     Nose: Nose normal.     Mouth/Throat:     Mouth: Mucous membranes are moist.  Eyes:     Extraocular Movements: Extraocular movements intact.     Pupils: Pupils are equal, round, and reactive to light.  Cardiovascular:     Rate and Rhythm: Normal rate and regular rhythm.  Pulses: Normal pulses.     Heart sounds: Normal heart sounds.  Pulmonary:     Comments: Tachypneic, diminished breath sounds bilaterally but no obvious wheezing or crackles Abdominal:     General: Abdomen is flat.     Palpations: Abdomen is soft.  Musculoskeletal:        General: Normal range of motion.     Cervical back: Normal range of motion and neck supple.  Skin:    General: Skin is warm.     Capillary Refill: Capillary refill takes less than 2 seconds.  Neurological:     General: No focal deficit present.     Mental Status: She is oriented to person, place, and time.  Psychiatric:        Mood and Affect: Mood normal.        Behavior: Behavior normal.    ED Results / Procedures / Treatments   Labs (all labs ordered are listed, but only abnormal results are displayed) Labs  Reviewed  CBC WITH DIFFERENTIAL/PLATELET - Abnormal; Notable for the following components:      Result Value   WBC 13.4 (*)    Neutro Abs 10.3 (*)    Abs Immature Granulocytes 0.18 (*)    All other components within normal limits  COMPREHENSIVE METABOLIC PANEL - Abnormal; Notable for the following components:   Glucose, Bld 134 (*)    Calcium 8.8 (*)    All other components within normal limits  RESP PANEL BY RT-PCR (FLU A&B, COVID) ARPGX2  TROPONIN I (HIGH SENSITIVITY)  TROPONIN I (HIGH SENSITIVITY)    EKG EKG Interpretation  Date/Time:  Saturday August 17 2021 14:37:25 EST Ventricular Rate:  96 PR Interval:  128 QRS Duration: 88 QT Interval:  326 QTC Calculation: 411 R Axis:   51 Text Interpretation: Normal sinus rhythm Normal ECG When compared with ECG of 26-Aug-2018 11:03, PREVIOUS ECG IS PRESENT Confirmed by Wandra Arthurs [phone]) on 08/17/2021 4:04:23 PM  Radiology DG Chest 2 View  Result Date: 08/17/2021 CLINICAL DATA:  Cough. EXAM: CHEST - 2 VIEW COMPARISON:  None. FINDINGS: The heart size and mediastinal contours are within normal limits. Both lungs are clear. The visualized skeletal structures are unremarkable. IMPRESSION: No active cardiopulmonary disease. Electronically Signed   By: Dorise Bullion III M.D.   On: 08/17/2021 15:50   CT Angio Chest PE W and/or Wo Contrast  Result Date: 08/17/2021 CLINICAL DATA:  Pulmonary embolism (PE) suspected, high prob. Chest pain EXAM: CT ANGIOGRAPHY CHEST WITH CONTRAST TECHNIQUE: Multidetector CT imaging of the chest was performed using the standard protocol during bolus administration of intravenous contrast. Multiplanar CT image reconstructions and MIPs were obtained to evaluate the vascular anatomy. CONTRAST:  15mL OMNIPAQUE IOHEXOL 350 MG/ML SOLN COMPARISON:  None. FINDINGS: Cardiovascular: No filling defects in the pulmonary arteries to suggest pulmonary emboli. Heart is normal size. Aorta is normal caliber. Mediastinum/Nodes: No  mediastinal, hilar, or axillary adenopathy. Trachea and esophagus are unremarkable. Thyroid unremarkable. Lungs/Pleura: Lungs are clear. No focal airspace opacities or suspicious nodules. No effusions. Upper Abdomen: Imaging into the upper abdomen demonstrates no acute findings. Musculoskeletal: Chest wall soft tissues are unremarkable. No acute bony abnormality. Review of the MIP images confirms the above findings. IMPRESSION: No evidence of pulmonary embolus. No acute cardiopulmonary disease. Electronically Signed   By: Rolm Baptise M.D.   On: 08/17/2021 19:47    Procedures Procedures    Medications Ordered in ED Medications  sodium chloride 0.9 % bolus 1,000 mL (1,000 mLs Intravenous  New Bag/Given 08/17/21 1818)  methylPREDNISolone sodium succinate (SOLU-MEDROL) 125 mg/2 mL injection 125 mg (125 mg Intravenous Given 08/17/21 1810)  albuterol (PROVENTIL) (2.5 MG/3ML) 0.083% nebulizer solution 5 mg (5 mg Nebulization Given 08/17/21 1818)  ipratropium (ATROVENT) nebulizer solution 0.5 mg (0.5 mg Nebulization Given 08/17/21 1818)  magnesium sulfate IVPB 2 g 50 mL (0 g Intravenous Stopped 08/17/21 1945)  benzonatate (TESSALON) capsule 100 mg (100 mg Oral Given 08/17/21 1948)  iohexol (OMNIPAQUE) 350 MG/ML injection 61 mL (61 mLs Intravenous Contrast Given 08/17/21 1944)    ED Course/ Medical Decision Making/ A&P                           Medical Decision Making Julie Hess is a 51 y.o. female here presenting with persistent cough and blood-tinged sputum.  Patient has been coughing for several days.  She states that she has recurrent pneumonia.  We will get CTA chest to rule out PE or mass.  Will check basic blood work and troponin as well  8:35 PM Patient's hemoglobin is stable.  CT PE did not show any PE or pneumonia.  COVID and flu test pending but she had a negative test 2 days ago.  I think likely bronchitis.  We will increase her dose of steroids.  We will have her follow-up with pulmonary  outpatient.    Amount and/or Complexity of Data Reviewed External Data Reviewed: labs and radiology. Labs: ordered. Decision-making details documented in ED Course. Radiology: ordered and independent interpretation performed. ECG/medicine tests: ordered and independent interpretation performed.    Final Clinical Impression(s) / ED Diagnoses Final diagnoses:  None    Rx / DC Orders ED Discharge Orders     None         Drenda Freeze, MD 08/17/21 2036

## 2021-08-17 NOTE — ED Notes (Signed)
Got patient into a gown on the monitor patient is resting with call bell in reach 

## 2021-08-17 NOTE — Discharge Instructions (Addendum)
Increase prednisone as prescribed.   Take tessalon pearl as needed   Stay hydrated  Finish your antibiotics as prescribed by your doctor   Consider calling your pulmonary doctor for appointment  Return to ER if you have worse shortness of breath, chest pain, trouble breathing, fever

## 2021-08-30 DIAGNOSIS — G459 Transient cerebral ischemic attack, unspecified: Secondary | ICD-10-CM

## 2021-08-30 DIAGNOSIS — R0609 Other forms of dyspnea: Secondary | ICD-10-CM | POA: Insufficient documentation

## 2021-08-30 DIAGNOSIS — J219 Acute bronchiolitis, unspecified: Secondary | ICD-10-CM | POA: Insufficient documentation

## 2021-08-30 HISTORY — DX: Transient cerebral ischemic attack, unspecified: G45.9

## 2021-09-04 DIAGNOSIS — R002 Palpitations: Secondary | ICD-10-CM | POA: Insufficient documentation

## 2021-09-04 DIAGNOSIS — R0789 Other chest pain: Secondary | ICD-10-CM | POA: Insufficient documentation

## 2021-12-09 HISTORY — PX: ANAL FISSURECTOMY: SUR608

## 2022-02-19 DIAGNOSIS — F419 Anxiety disorder, unspecified: Secondary | ICD-10-CM | POA: Insufficient documentation

## 2022-04-09 ENCOUNTER — Other Ambulatory Visit: Payer: Self-pay | Admitting: Orthopedic Surgery

## 2022-05-21 NOTE — Progress Notes (Signed)
Surgical Instructions    Your procedure is scheduled on Wednesday, 06/11/22.  Report to [facility] Main Entrance "A" at 6:30 A.M., then check in with the Admitting office.  Call this number if you have problems the morning of surgery:  [phone]   If you have any questions prior to your surgery date call [phone]: Open Monday-Friday 8am-4pm If you experience any cold or flu symptoms such as cough, fever, chills, shortness of breath, etc. between now and your scheduled surgery, please notify us at the above number     Remember:  Do not eat after midnight the night before your surgery  You may drink clear liquids until 5:30am the morning of your surgery.   Clear liquids allowed are: Water, Non-Citrus Juices (without pulp), Carbonated Beverages, Clear Tea, Black Coffee ONLY (NO MILK, CREAM OR POWDERED CREAMER of any kind), and Gatorade  Patient Instructions  The night before surgery:  No food after midnight. ONLY clear liquids after midnight  The day of surgery (if you do NOT have diabetes):  Drink ONE (1) Pre-Surgery Clear Ensure by 5:30am the morning of surgery. Drink in one sitting. Do not sip.  This drink was given to you during your hospital  pre-op appointment visit. Nothing else to drink after completing the  Pre-Surgery Clear Ensure.           If you have questions, please contact your surgeon's office.     Take these medicines the morning of surgery with A SIP OF WATER:  BREO ELLIPTA  busPIRone (BUSPAR)  fexofenadine (ALLEGRA) levothyroxine (SYNTHROID, LEVOTHROID) pantoprazole (PROTONIX) sulfaSALAzine (AZULFIDINE)  IF NEEDED: albuterol (VENTOLIN HFA) inhaler- bring with you the day of surgery diazepam (VALIUM) hyoscyamine (LEVSIN SL)  NURTEC   As of today, STOP taking any Aspirin (unless otherwise instructed by your surgeon) Aleve, Naproxen, Ibuprofen, Motrin, Advil, Goody's, BC's, all herbal medications, fish oil, and all vitamins.           Do not  wear jewelry or makeup. Do not wear lotions, powders, perfumes or deodorant. Do not shave 48 hours prior to surgery.   Do not bring valuables to the hospital. Do not wear nail polish, gel polish, artificial nails, or any other type of covering on natural nails (fingers and toes) If you have artificial nails or gel coating that need to be removed by a nail salon, please have this removed prior to surgery. Artificial nails or gel coating may interfere with anesthesia's ability to adequately monitor your vital signs.  [facility] is not responsible for any belongings or valuables.    Do NOT Smoke (Tobacco/Vaping)  24 hours prior to your procedure  If you use a CPAP at night, you may bring your mask for your overnight stay.   Contacts, glasses, hearing aids, dentures or partials may not be worn into surgery, please bring cases for these belongings   For patients admitted to the hospital, discharge time will be determined by your treatment team.   Patients discharged the day of surgery will not be allowed to drive home, and someone needs to stay with them for 24 hours.   SURGICAL WAITING ROOM VISITATION Patients having surgery or a procedure may have no more than 2 support people in the waiting area - these visitors may rotate.   Children under the age of 18 must have an adult with them who is not the patient. If the patient needs to stay at the hospital during part of their recovery, the visitor guidelines for inpatient  rooms apply. Pre-op nurse will coordinate an appropriate time for 1 support person to accompany patient in pre-op.  This support person may not rotate.   Please refer to the [facility] website for the visitor guidelines for Inpatients (after your surgery is over and you are in a regular room).    Special instructions:    Oral Hygiene is also important to reduce your risk of infection.  Remember - BRUSH YOUR TEETH THE MORNING OF SURGERY WITH YOUR REGULAR TOOTHPASTE   [facility]- Preparing For Surgery  Before surgery, you can play an important role. Because skin is not sterile, your skin needs to be as free of germs as possible. You can reduce the number of germs on your skin by washing with CHG (chlorahexidine gluconate) Soap before surgery.  CHG is an antiseptic cleaner which kills germs and bonds with the skin to continue killing germs even after washing.     Please do not use if you have an allergy to CHG or antibacterial soaps. If your skin becomes reddened/irritated stop using the CHG.  Do not shave (including legs and underarms) for at least 48 hours prior to first CHG shower. It is OK to shave your face.  Please follow these instructions carefully.     Shower the NIGHT BEFORE SURGERY and the MORNING OF SURGERY with CHG Soap.   If you chose to wash your hair, wash your hair first as usual with your normal shampoo. After you shampoo, rinse your hair and body thoroughly to remove the shampoo.  Then [employer] and genitals (private parts) with your normal soap and rinse thoroughly to remove soap.  After that Use CHG Soap as you would any other liquid soap. You can apply CHG directly to the skin and wash gently with a scrungie or a clean washcloth.   Apply the CHG Soap to your body ONLY FROM THE NECK DOWN.  Do not use on open wounds or open sores. Avoid contact with your eyes, ears, mouth and genitals (private parts). Wash Face and genitals (private parts)  with your normal soap.   Wash thoroughly, paying special attention to the area where your surgery will be performed.  Thoroughly rinse your body with warm water from the neck down.  DO NOT shower/wash with your normal soap after using and rinsing off the CHG Soap.  Pat yourself dry with a CLEAN TOWEL.  Wear CLEAN PAJAMAS to bed the night before surgery  Place CLEAN SHEETS on your bed the night before your surgery  DO NOT SLEEP WITH PETS.   Day of Surgery: Take a shower with CHG soap. Wear  Clean/Comfortable clothing the morning of surgery Do not apply any deodorants/lotions.   Remember to brush your teeth WITH YOUR REGULAR TOOTHPASTE.    If you received a COVID test during your pre-op visit, it is requested that you wear a mask when out in public, stay away from anyone that may not be feeling well, and notify your surgeon if you develop symptoms. If you have been in contact with anyone that has tested positive in the last 10 days, please notify your surgeon.    Please read over the following fact sheets that you were given.

## 2022-05-22 ENCOUNTER — Encounter (HOSPITAL_COMMUNITY)
Admission: RE | Admit: 2022-05-22 | Discharge: 2022-05-22 | Disposition: A | Discharge status: Home / Self Care | Payer: BC Managed Care – PPO | Source: Ambulatory Visit | Attending: Orthopedic Surgery | Admitting: Orthopedic Surgery

## 2022-05-22 ENCOUNTER — Encounter (HOSPITAL_COMMUNITY): Payer: Self-pay

## 2022-05-22 ENCOUNTER — Other Ambulatory Visit: Payer: Self-pay

## 2022-05-22 VITALS — BP 145/89 | Temp 97.7°F | Resp 18 | Ht 65.0 in | Wt 298.1 lb

## 2022-05-22 DIAGNOSIS — Z01812 Encounter for preprocedural laboratory examination: Secondary | ICD-10-CM | POA: Diagnosis present

## 2022-05-22 DIAGNOSIS — Z01818 Encounter for other preprocedural examination: Secondary | ICD-10-CM

## 2022-05-22 HISTORY — DX: Other reaction to spinal and lumbar puncture: G97.1

## 2022-05-22 LAB — CBC
HCT: 43.7 % (ref 36.0–46.0)
Hemoglobin: 13.9 g/dL (ref 12.0–15.0)
MCH: 30.7 pg (ref 26.0–34.0)
MCHC: 31.8 g/dL (ref 30.0–36.0)
MCV: 96.5 fL (ref 80.0–100.0)
Platelets: 312 10*3/uL (ref 150–400)
RBC: 4.53 MIL/uL (ref 3.87–5.11)
RDW: 14.9 % (ref 11.5–15.5)
WBC: 10.2 10*3/uL (ref 4.0–10.5)
nRBC: 0 % (ref 0.0–0.2)

## 2022-05-22 LAB — TYPE AND SCREEN
ABO/RH(D): A POS
Antibody Screen: NEGATIVE

## 2022-05-22 LAB — BASIC METABOLIC PANEL
Anion gap: 7 (ref 5–15)
BUN: 14 mg/dL (ref 6–20)
CO2: 24 mmol/L (ref 22–32)
Calcium: 8.7 mg/dL — ABNORMAL LOW (ref 8.9–10.3)
Chloride: 109 mmol/L (ref 98–111)
Creatinine, Ser: 0.77 mg/dL (ref 0.44–1.00)
GFR, Estimated: >60 mL/min (ref >60)
Glucose, Bld: 84 mg/dL (ref 70–99)
Potassium: 3.9 mmol/L (ref 3.5–5.1)
Sodium: 140 mmol/L (ref 135–145)

## 2022-05-22 LAB — SURGICAL PCR SCREEN
MRSA, PCR: NEGATIVE
Staphylococcus aureus: NEGATIVE

## 2022-05-22 NOTE — Progress Notes (Addendum)
PCP - Nelly Laurence NP Cardiologist - denies  PPM/ICD - denies  Chest x-ray - n/a EKG - 08/17/21 Stress Test - 09/18/21- [facility] ECHO - 09/02/21 Cardiac Cath - denies  Sleep Study - yes CPAP - yes  As of today, STOP taking any Aspirin (unless otherwise instructed by your surgeon) Aleve, Naproxen, Ibuprofen, Motrin, Advil, Goody's, BC's, all herbal medications, fish oil, and all vitamins.  ERAS Protcol - yes PRE-SURGERY Ensure or G2- ensure  COVID TEST- no   Anesthesia review: yes- "mini stroke" in January. [facility]  Patient asked about getting Covid booster prior to surgery. Contacted [place] PA who said she wouldn't recommend getting it the day prior but that there shouldn't be a contraindication.  Patient denies shortness of breath, fever, cough and chest pain at PAT appointment  All instructions explained to the patient, with a verbal understanding of the material. Patient agrees to go over the instructions while at home for a better understanding. The opportunity to ask questions was provided.

## 2022-05-23 NOTE — Anesthesia Preprocedure Evaluation (Addendum)
Anesthesia Evaluation  Patient identified by MRN, date of birth, ID band Patient awake    Reviewed: Allergy & Precautions, NPO status , Patient's Chart, lab work & pertinent test results  History of Anesthesia Complications Negative for: history of anesthetic complications  Airway Mallampati: II  TM Distance: >3 FB Neck ROM: Full    Dental  (+) Chipped, Dental Advisory Given   Pulmonary shortness of breath, asthma , sleep apnea and Continuous Positive Airway Pressure Ventilation , COPD,  COPD inhaler, former smoker   breath sounds clear to auscultation       Cardiovascular hypertension, Pt. on medications (-) angina  Rhythm:Regular Rate:Normal  09/2021 stress: EF 63%, no ischemia at rest or stress   Neuro/Psych  Headaches  Anxiety     TIA   GI/Hepatic Neg liver ROS,GERD  Medicated and Controlled,,  Endo/Other  Hypothyroidism  Morbid obesityBMI 79  Renal/GU negative Renal ROS     Musculoskeletal   Abdominal   Peds  Hematology negative hematology ROS (+)   Anesthesia Other Findings   Reproductive/Obstetrics                              Anesthesia Physical Anesthesia Plan  ASA: 3  Anesthesia Plan: General   Post-op Pain Management: Tylenol PO (pre-op)*   Induction: Intravenous  PONV Risk Score and Plan: 3 and Ondansetron, Dexamethasone and Scopolamine patch - Pre-op  Airway Management Planned: Oral ETT  Additional Equipment: None  Intra-op Plan:   Post-operative Plan: Extubation in OR  Informed Consent: I have reviewed the patients History and Physical, chart, labs and discussed the procedure including the risks, benefits and alternatives for the proposed anesthesia with the patient or authorized representative who has indicated his/her understanding and acceptance.     Dental advisory given  Plan Discussed with: CRNA and Surgeon  Anesthesia Plan Comments: (PAT note written  by Myra Gianotti, PA-C. )         Anesthesia Quick Evaluation

## 2022-05-23 NOTE — Progress Notes (Signed)
Anesthesia Chart Review:  Case: [id number] Date/Time: 06/11/22 0815   Procedure: RIGHT-SIDED LUMBAR 2- LUMBAR 3 LATERAL INTERBODY FUSION WITH INSTRUMENTATION AND ALLOGRAFT (Right)   Anesthesia type: General   Pre-op diagnosis: 2 month history of progressive bilateral thigh pain and weakness, secondary to the patient's severe spinal stenosis at L2-3, in addition to her retrolisthesis.   Location: [place] [facility] [place] OR   Surgeons: Phylliss Bob, MD       DISCUSSION: Patient is a 51 year old female scheduled for the above procedure.  History includes former smoker (quit 08/11/04), spinal headache, HTN, ulcerative colitis, hypothyroidism, GERD, migraines, asthma, exertional dyspnea, HLD, OSA (uses CPAP), anemia, TIA (versus complicated migraine 9/89/21), spinal surgery (revision laminectomy & L4-S1 09/28/17; C4-7 ACDF 08/26/18; left L3-4 lateral interbody fusion 03/30/19 & L3-4 PSF 03/31/19), hysterectomy (2002).  BMI is consistent with morbid obesity.  She was hospitalized at [facility] in January 2023. I received a copy of the 08/31/21-08/31/21 Discharge Summary indicating she was admitted for a complicated migraine versus TIA. She had presented with reported symptoms of headaches, right sided numbness and blurred vision and slurred speech that started the night before that had improved, although partial visual field deficit remained on evaluation but resolved within hours. She had a teleneurology consult. ASA 81 mg was planned but she wished to discuss with GI first given history of ulcerative colitis with bloody bowel movements in the past. By notes, CT brain was negative and CTA revealed an atrophic left vertebral artery, but no other abnormality.  Neurology did not feel that there was any dissection.  Carotid ultrasound was unremarkable, showing retrograde flow in the left vertebral artery.  Echocardiogram was also unremarkable showing no PFO.  No arrhythmia by telemetry monitor.  A1c was  5.1%.  She had cardiac testing earlier this year including nonischemic stress test in 09/2021, NSR on 30-day heart monitor in 09/2021, normal LVEF 08/2021 echo.   Anesthesia team to evaluate on the day of surgery.     VS: BP (!) 145/89   Temp 36.5 C (Oral)   Resp 18   Ht 5\' 5"  (1.651 m)   Wt 135.2 kg   SpO2 100%   BMI 49.61 kg/m    PROVIDERS: Nelly Laurence, NP is PCP  - Idolina Primer, MD is cardiologist [facility]). Last visit 10/24/2021.  She was doing well, compensated. Continue Lasix daily.  Nonischemic stress test in 09/2021.  NSR on 30-day heart monitor 09/2021.  08/2021 echo showed normal LVEF.  Follow-up in 9 months.  - Jamey Reas, MD is GI ([facility]) - Daine Floras, MD is endocrinologist ([facility]) - Dollene Cleveland, MD is pulmonologist [facility]; [facility])   LABS: Labs reviewed: Acceptable for surgery. (all labs ordered are listed, but only abnormal results are displayed)  Labs Reviewed  BASIC METABOLIC PANEL - Abnormal; Notable for the following components:      Result Value   Calcium 8.7 (*)    All other components within normal limits  SURGICAL PCR SCREEN  CBC  TYPE AND SCREEN    OTHER:  Spirometry 08/20/21 ([facility]): FVC 3.23 (104.5%), post 3.17 (102.6%). FEV1 2.69 (107%), post 2.73 (108.4%). Normal spirometry. There is no significant response to bronchodilator inhalation; however, this does not preclude benefit from use of a bronchodilator.   FLOW VOLUME LOOPS: Flow-volume loop does not suggest any fixed airway obstruction  Sleep Study 01/2017 ([facility]): "AHI (Apnea-Hypopnea Index): 25.1 /hr with Nadir Saturation (%): 84 % consistent with obstructive sleep apnea."  IMAGES: MRI L-spine 03/26/22 ([facility]): IMPRESSION:  1.  L2-3 there is 6 mm grade 1 retrolisthesis increased compared to prior. There is a new inferiorly migrating disc extrusion causing increased moderate central canal stenosis.  2.  Fusion and decompression surgery with well-maintained  central canal at L3-4, L4-5, and L5-S1.   MRI C-spine 03/26/22 ([facility]): IMPRESSION:  1.  Interval ACDF C4-5, C5-6 and C6-7. Fixating hardware in place without obvious complications. No significant stenosis appreciated at either level.  2.  There is adjacent level disease at C3-4 with small central disc protrusion abutting the ventral spinal cord. Preservation the posterior subarachnoid space. No significant foraminal stenosis.  3.  There is adjacent level disease at C7-T1 resulting in mild right and moderate left foraminal stenosis.  4.  No acute bony abnormality.  5.  No convincing intrinsic spinal cord abnormality.   CTA Chest 08/17/21: IMPRESSION: No evidence of pulmonary embolus. No acute cardiopulmonary disease.    EKG: EKG 08/17/21: NSR   CV: 30 Day Cardiac Event Monitor 09/11/21 ([facility]): IMPRESSION: 30d monitor:  NSR with typical HR variation. Avg HR of 76 bpm. Minimal artifact.  No significant slowing, pauses or block.  No significant ectopy, atrial or ventricular arrhythmias.  No abnormalities during reported symptoms. Symptoms noted with SR, ST.     Nuclear stress test 09/18/21 ([facility]): Impression:  1.  No ischemic ECG changes noted during stress.  2.  Normal blood pressure response to stress.  3.  Myocardial perfusion imaging shows no reversible or fixed defects, suggesting the absence of inducible ischemia and myocardial scarring.  4.  Normal left ventricular function with calculated EF 63%.  No segmental wall motion abnormalities appreciated.  5.  This is a low risk pharmacologic nuclear stress test.     Echo 08/31/21 [facility]):  Summary:  LVEF 60 to 65%.  The left ventricular wall motion is normal.   Normal right ventricular size and function. Left atrial size is normal.  Bubble study was negative indicating no PFO or ASD. Trivial mitral regurgitation.  Trace tricuspid regurgitation.   US Carotid 08/31/21 [facility]): Per 09/02/21 Discharge Summary: "Carotid ultrasound was unremarkable, showing retrograde flow in the left vertebral artery."   CTA Coronary w/ calcium Scoring 12/24/18 ([facility]): IMPRESSION:  -  Calcium score of 1  -  Normal left ventricular function with EF of 61%  -  Technically difficult study.  No plaque or stenosis visualized within the coronary arteries.  Unfortunately, only the proximal large vessels were well visualized, and cannot rule out distal vessel disease.       Past Medical History:  Diagnosis Date   Anemia    with ulcerative colitis   Anxiety    Asthma    very mild per pt   Complication of anesthesia    Hard time waking up after C-Section 1997, but tolerated anesthesia since then no issues   Dyspnea    with activity   GERD (gastroesophageal reflux disease)    Hyperlipidemia    Hypertension    Hypothyroidism    Migraines    migraines   Sleep apnea    uses cpap   Spinal headache    Thyroid disease    TIA (transient ischemic attack) 08/30/2021   TIA versus complicated migraine, hospitalized 08/31/21 for work-up at [facility]   Ulcerative colitis, chronic [facility])     Past Surgical History:  Procedure Laterality Date   ABDOMINAL HYSTERECTOMY  2002  ANAL FISSURECTOMY  12/2021   ANTERIOR CERVICAL DECOMPRESSION/DISCECTOMY FUSION 4 LEVEL/HARDWARE REMOVAL N/A 08/26/2018   Procedure: ANTERIOR CERVICAL DECOMPRESSION FUSION, CERVICAL 4-5, CERVICAL 5-6, CERVICAL 6-7 WITH INSTRUMENTATION AND ALLOGRAFT;  Surgeon: Phylliss Bob, MD;  Location: [place];  Service: Orthopedics;  Laterality: N/A;   ANTERIOR LAT LUMBAR FUSION Left 03/30/2019   Procedure: LEFT LATERAL LUMBAR THREE-FOUR INTERBODY FUSION WITH  ALLOGRAFT;  Surgeon: Phylliss Bob, MD;  Location: [place];  Service: Orthopedics;  Laterality: Left;  LEFT LATERAL LUMBAR THREE-FOUR INTERBODY FUSION WITH  ALLOGRAFT   APPENDECTOMY     BACK SURGERY  2019   lumbar fusion   BACK SURGERY      discetomy   CESAREAN SECTION  1997   CHOLECYSTECTOMY     SPINE SURGERY  08/11/1998   lumbar    MEDICATIONS:  albuterol (VENTOLIN HFA) 108 (90 Base) MCG/ACT inhaler   benzonatate (TESSALON) 100 MG capsule   BREO ELLIPTA 100-25 MCG/ACT AEPB   busPIRone (BUSPAR) 15 MG tablet   cholecalciferol (VITAMIN D3) 25 MCG (1000 UNIT) tablet   cyanocobalamin 1000 MCG tablet   diazepam (VALIUM) 2 MG tablet   diazepam (VALIUM) 5 MG tablet   estradiol (CLIMARA) 0.06 MG/24HR   fexofenadine (ALLEGRA) 180 MG tablet   furosemide (LASIX) 20 MG tablet   HYDROcodone-acetaminophen (NORCO) 10-325 MG tablet   hyoscyamine (LEVSIN SL) 0.125 MG SL tablet   levothyroxine (SYNTHROID, LEVOTHROID) 75 MCG tablet   losartan (COZAAR) 50 MG tablet   montelukast (SINGULAIR) 10 MG tablet   NURTEC 75 MG TBDP   pantoprazole (PROTONIX) 40 MG tablet   pravastatin (PRAVACHOL) 40 MG tablet   predniSONE (DELTASONE) 20 MG tablet   sulfaSALAzine (AZULFIDINE) 500 MG tablet   valACYclovir (VALTREX) 1000 MG tablet   vitamin C (ASCORBIC ACID) 500 MG tablet   No current facility-administered medications for this encounter.  She is not currently taking prednisone taper.   Myra Gianotti, PA-C Surgical Short Stay/Anesthesiology [facility] Phone [phone] [facility] Phone [phone] 05/26/2022 10:17 AM

## 2022-05-26 ENCOUNTER — Encounter (HOSPITAL_COMMUNITY): Payer: Self-pay

## 2022-06-10 DIAGNOSIS — K219 Gastro-esophageal reflux disease without esophagitis: Secondary | ICD-10-CM | POA: Insufficient documentation

## 2022-06-11 ENCOUNTER — Inpatient Hospital Stay (HOSPITAL_COMMUNITY): Payer: BC Managed Care – PPO | Admitting: Vascular Surgery

## 2022-06-11 ENCOUNTER — Encounter (HOSPITAL_COMMUNITY)
Admission: RE | Disposition: A | Discharge status: Home / Self Care | Payer: Self-pay | Source: Home / Self Care | Attending: Orthopedic Surgery

## 2022-06-11 ENCOUNTER — Inpatient Hospital Stay: Payer: Self-pay

## 2022-06-11 ENCOUNTER — Encounter (HOSPITAL_COMMUNITY): Payer: Self-pay | Admitting: Orthopedic Surgery

## 2022-06-11 ENCOUNTER — Inpatient Hospital Stay (HOSPITAL_COMMUNITY): Payer: BC Managed Care – PPO | Admitting: Anesthesiology

## 2022-06-11 ENCOUNTER — Inpatient Hospital Stay (HOSPITAL_COMMUNITY): Payer: BC Managed Care – PPO

## 2022-06-11 ENCOUNTER — Other Ambulatory Visit: Payer: Self-pay

## 2022-06-11 ENCOUNTER — Inpatient Hospital Stay (HOSPITAL_COMMUNITY)
Admission: RE | Admit: 2022-06-11 | Discharge: 2022-06-13 | DRG: 454 | Disposition: A | Discharge status: Home / Self Care | Payer: BC Managed Care – PPO | Attending: Orthopedic Surgery | Admitting: Orthopedic Surgery

## 2022-06-11 DIAGNOSIS — E785 Hyperlipidemia, unspecified: Secondary | ICD-10-CM | POA: Diagnosis present

## 2022-06-11 DIAGNOSIS — J449 Chronic obstructive pulmonary disease, unspecified: Secondary | ICD-10-CM | POA: Diagnosis present

## 2022-06-11 DIAGNOSIS — Z888 Allergy status to other drugs, medicaments and biological substances status: Secondary | ICD-10-CM

## 2022-06-11 DIAGNOSIS — Z9049 Acquired absence of other specified parts of digestive tract: Secondary | ICD-10-CM

## 2022-06-11 DIAGNOSIS — E039 Hypothyroidism, unspecified: Secondary | ICD-10-CM | POA: Diagnosis present

## 2022-06-11 DIAGNOSIS — G4730 Sleep apnea, unspecified: Secondary | ICD-10-CM | POA: Diagnosis present

## 2022-06-11 DIAGNOSIS — Z9889 Other specified postprocedural states: Secondary | ICD-10-CM

## 2022-06-11 DIAGNOSIS — Z87891 Personal history of nicotine dependence: Secondary | ICD-10-CM | POA: Diagnosis not present

## 2022-06-11 DIAGNOSIS — M48061 Spinal stenosis, lumbar region without neurogenic claudication: Secondary | ICD-10-CM | POA: Diagnosis present

## 2022-06-11 DIAGNOSIS — Z885 Allergy status to narcotic agent status: Secondary | ICD-10-CM | POA: Diagnosis not present

## 2022-06-11 DIAGNOSIS — Z6841 Body Mass Index (BMI) 40.0 and over, adult: Secondary | ICD-10-CM

## 2022-06-11 DIAGNOSIS — K219 Gastro-esophageal reflux disease without esophagitis: Secondary | ICD-10-CM | POA: Diagnosis present

## 2022-06-11 DIAGNOSIS — F419 Anxiety disorder, unspecified: Secondary | ICD-10-CM | POA: Diagnosis present

## 2022-06-11 DIAGNOSIS — Z8673 Personal history of transient ischemic attack (TIA), and cerebral infarction without residual deficits: Secondary | ICD-10-CM | POA: Diagnosis not present

## 2022-06-11 DIAGNOSIS — Z90710 Acquired absence of both cervix and uterus: Secondary | ICD-10-CM | POA: Diagnosis not present

## 2022-06-11 DIAGNOSIS — M5416 Radiculopathy, lumbar region: Principal | ICD-10-CM | POA: Diagnosis present

## 2022-06-11 DIAGNOSIS — Z7951 Long term (current) use of inhaled steroids: Secondary | ICD-10-CM

## 2022-06-11 DIAGNOSIS — Z91013 Allergy to seafood: Secondary | ICD-10-CM

## 2022-06-11 DIAGNOSIS — Z79890 Hormone replacement therapy (postmenopausal): Secondary | ICD-10-CM | POA: Diagnosis not present

## 2022-06-11 DIAGNOSIS — M5116 Intervertebral disc disorders with radiculopathy, lumbar region: Secondary | ICD-10-CM | POA: Diagnosis present

## 2022-06-11 DIAGNOSIS — Z981 Arthrodesis status: Secondary | ICD-10-CM | POA: Diagnosis not present

## 2022-06-11 DIAGNOSIS — Z880 Allergy status to penicillin: Secondary | ICD-10-CM

## 2022-06-11 DIAGNOSIS — M4316 Spondylolisthesis, lumbar region: Secondary | ICD-10-CM | POA: Diagnosis present

## 2022-06-11 DIAGNOSIS — I10 Essential (primary) hypertension: Secondary | ICD-10-CM | POA: Diagnosis present

## 2022-06-11 HISTORY — PX: ANTERIOR LAT LUMBAR FUSION: SHX1168

## 2022-06-11 SURGERY — ANTERIOR LATERAL LUMBAR FUSION 1 LEVEL
Anesthesia: General | Laterality: Right

## 2022-06-11 MED ORDER — BUPIVACAINE-EPINEPHRINE 0.25% -1:200000 IJ SOLN
INTRAMUSCULAR | Status: DC | PRN
  Administered 2022-06-11: 6 mL

## 2022-06-11 MED ORDER — PROMETHAZINE HCL 25 MG/ML IJ SOLN: 6.2500 mg | INTRAMUSCULAR | Status: DC | PRN

## 2022-06-11 MED ORDER — PROPOFOL 10 MG/ML IV BOLUS
INTRAVENOUS | Status: DC | PRN
  Administered 2022-06-11: 200 mg via INTRAVENOUS
  Administered 2022-06-11: 40 mg via INTRAVENOUS

## 2022-06-11 MED ORDER — PROPOFOL 500 MG/50ML IV EMUL
INTRAVENOUS | Status: DC | PRN
  Administered 2022-06-11: 125 ug/kg/min via INTRAVENOUS

## 2022-06-11 MED ORDER — PHENOL 1.4 % MT LIQD: 1.0000 | OROMUCOSAL | Status: DC | PRN

## 2022-06-11 MED ORDER — PHENYLEPHRINE 80 MCG/ML (10ML) SYRINGE FOR IV PUSH (FOR BLOOD PRESSURE SUPPORT)
PREFILLED_SYRINGE | INTRAVENOUS | Status: DC | PRN
  Administered 2022-06-11 (×4): 160 ug via INTRAVENOUS

## 2022-06-11 MED ORDER — ONDANSETRON HCL 4 MG/2ML IJ SOLN: 4.0000 mg | Freq: Four times a day (QID) | INTRAMUSCULAR | Status: DC | PRN

## 2022-06-11 MED ORDER — ONDANSETRON HCL 4 MG PO TABS
4.0000 mg | ORAL_TABLET | Freq: Four times a day (QID) | ORAL | Status: DC | PRN
  Administered 2022-06-13: 4 mg via ORAL
  Filled 2022-06-11: qty 1

## 2022-06-11 MED ORDER — 0.9 % SODIUM CHLORIDE (POUR BTL) OPTIME
TOPICAL | Status: DC | PRN
  Administered 2022-06-11: 1000 mL

## 2022-06-11 MED ORDER — ZOLPIDEM TARTRATE 5 MG PO TABS: 5.0000 mg | ORAL_TABLET | Freq: Every evening | ORAL | Status: DC | PRN

## 2022-06-11 MED ORDER — POVIDONE-IODINE 7.5 % EX SOLN
Freq: Once | CUTANEOUS | Status: DC
  Filled 2022-06-11: qty 118

## 2022-06-11 MED ORDER — FUROSEMIDE 20 MG PO TABS
20.0000 mg | ORAL_TABLET | Freq: Every day | ORAL | Status: DC
  Administered 2022-06-11 – 2022-06-13 (×2): 20 mg via ORAL
  Filled 2022-06-11 (×2): qty 1

## 2022-06-11 MED ORDER — LIDOCAINE 2% (20 MG/ML) 5 ML SYRINGE
INTRAMUSCULAR | Status: DC | PRN
  Administered 2022-06-11: 100 mg via INTRAVENOUS

## 2022-06-11 MED ORDER — KETAMINE HCL 50 MG/5ML IJ SOSY
PREFILLED_SYRINGE | INTRAMUSCULAR | Status: AC
  Filled 2022-06-11: qty 5

## 2022-06-11 MED ORDER — LACTATED RINGERS IV SOLN: INTRAVENOUS | Status: DC

## 2022-06-11 MED ORDER — MORPHINE SULFATE (PF) 2 MG/ML IV SOLN: 1.0000 mg | INTRAVENOUS | Status: DC | PRN

## 2022-06-11 MED ORDER — ORAL CARE MOUTH RINSE: 15.0000 mL | Freq: Once | OROMUCOSAL | Status: AC

## 2022-06-11 MED ORDER — ALUM & MAG HYDROXIDE-SIMETH 200-200-20 MG/5ML PO SUSP: 30.0000 mL | Freq: Four times a day (QID) | ORAL | Status: DC | PRN

## 2022-06-11 MED ORDER — GLYCOPYRROLATE PF 0.2 MG/ML IJ SOSY
PREFILLED_SYRINGE | INTRAMUSCULAR | Status: AC
  Filled 2022-06-11: qty 1

## 2022-06-11 MED ORDER — BUSPIRONE HCL 15 MG PO TABS
15.0000 mg | ORAL_TABLET | Freq: Two times a day (BID) | ORAL | Status: DC
  Administered 2022-06-11 – 2022-06-13 (×4): 15 mg via ORAL
  Filled 2022-06-11 (×4): qty 1

## 2022-06-11 MED ORDER — ENSURE PRE-SURGERY PO LIQD
296.0000 mL | Freq: Once | ORAL | Status: AC
  Administered 2022-06-12: 296 mL via ORAL
  Filled 2022-06-11: qty 296

## 2022-06-11 MED ORDER — HYDROMORPHONE HCL 1 MG/ML IJ SOLN
INTRAMUSCULAR | Status: AC
  Filled 2022-06-11: qty 1

## 2022-06-11 MED ORDER — VANCOMYCIN HCL 1500 MG/300ML IV SOLN
1500.0000 mg | INTRAVENOUS | Status: DC
  Filled 2022-06-11 (×2): qty 300

## 2022-06-11 MED ORDER — ALBUTEROL SULFATE HFA 108 (90 BASE) MCG/ACT IN AERS: 1.0000 | INHALATION_SPRAY | Freq: Four times a day (QID) | RESPIRATORY_TRACT | Status: DC | PRN

## 2022-06-11 MED ORDER — SCOPOLAMINE 1 MG/3DAYS TD PT72
1.0000 | MEDICATED_PATCH | TRANSDERMAL | Status: DC
  Administered 2022-06-11: 1.5 mg via TRANSDERMAL
  Filled 2022-06-11: qty 1

## 2022-06-11 MED ORDER — HYDROMORPHONE HCL 1 MG/ML IJ SOLN
0.2500 mg | INTRAMUSCULAR | Status: DC | PRN
  Administered 2022-06-11 (×2): 0.5 mg via INTRAVENOUS

## 2022-06-11 MED ORDER — HYOSCYAMINE SULFATE 0.125 MG SL SUBL: 0.1250 mg | SUBLINGUAL_TABLET | Freq: Four times a day (QID) | SUBLINGUAL | Status: DC | PRN

## 2022-06-11 MED ORDER — PROPOFOL 10 MG/ML IV BOLUS
INTRAVENOUS | Status: AC
  Filled 2022-06-11: qty 20

## 2022-06-11 MED ORDER — RIMEGEPANT SULFATE 75 MG PO TBDP: 1.0000 | ORAL_TABLET | Freq: Every day | ORAL | Status: DC | PRN

## 2022-06-11 MED ORDER — HYDROMORPHONE HCL 1 MG/ML IJ SOLN
INTRAMUSCULAR | Status: DC | PRN
  Administered 2022-06-11 (×2): .25 mg via INTRAVENOUS

## 2022-06-11 MED ORDER — SUCCINYLCHOLINE CHLORIDE 200 MG/10ML IV SOSY
PREFILLED_SYRINGE | INTRAVENOUS | Status: DC | PRN
  Administered 2022-06-11: 160 mg via INTRAVENOUS

## 2022-06-11 MED ORDER — VITAMIN D 25 MCG (1000 UNIT) PO TABS
1000.0000 [IU] | ORAL_TABLET | Freq: Every day | ORAL | Status: DC
  Administered 2022-06-11 – 2022-06-13 (×3): 1000 [IU] via ORAL
  Filled 2022-06-11 (×3): qty 1

## 2022-06-11 MED ORDER — THROMBIN 20000 UNITS EX SOLR
CUTANEOUS | Status: AC
  Filled 2022-06-11: qty 20000

## 2022-06-11 MED ORDER — METHOCARBAMOL 500 MG PO TABS
500.0000 mg | ORAL_TABLET | Freq: Four times a day (QID) | ORAL | Status: DC | PRN
  Administered 2022-06-11: 1000 mg via ORAL
  Administered 2022-06-11 – 2022-06-13 (×3): 500 mg via ORAL
  Filled 2022-06-11: qty 1
  Filled 2022-06-11: qty 2
  Filled 2022-06-11 (×3): qty 1

## 2022-06-11 MED ORDER — HYDROCODONE-ACETAMINOPHEN 5-325 MG PO TABS
1.0000 | ORAL_TABLET | ORAL | Status: DC | PRN
  Administered 2022-06-11: 2 via ORAL
  Filled 2022-06-11 (×2): qty 2

## 2022-06-11 MED ORDER — PANTOPRAZOLE SODIUM 40 MG PO TBEC
40.0000 mg | DELAYED_RELEASE_TABLET | Freq: Two times a day (BID) | ORAL | Status: DC
  Administered 2022-06-11 – 2022-06-13 (×5): 40 mg via ORAL
  Filled 2022-06-11 (×5): qty 1

## 2022-06-11 MED ORDER — SODIUM CHLORIDE 0.9 % IV SOLN
250.0000 mL | INTRAVENOUS | Status: DC
  Administered 2022-06-11: 250 mL via INTRAVENOUS

## 2022-06-11 MED ORDER — CHLORHEXIDINE GLUCONATE 0.12 % MT SOLN
15.0000 mL | Freq: Once | OROMUCOSAL | Status: AC
  Administered 2022-06-11: 15 mL via OROMUCOSAL
  Filled 2022-06-11: qty 15

## 2022-06-11 MED ORDER — ACETAMINOPHEN 500 MG PO TABS
1000.0000 mg | ORAL_TABLET | Freq: Once | ORAL | Status: AC
  Administered 2022-06-12: 1000 mg via ORAL
  Filled 2022-06-11: qty 2

## 2022-06-11 MED ORDER — PROPOFOL 1000 MG/100ML IV EMUL
INTRAVENOUS | Status: AC
  Filled 2022-06-11: qty 100

## 2022-06-11 MED ORDER — BUPIVACAINE-EPINEPHRINE (PF) 0.25% -1:200000 IJ SOLN
INTRAMUSCULAR | Status: AC
  Filled 2022-06-11: qty 30

## 2022-06-11 MED ORDER — PRAVASTATIN SODIUM 40 MG PO TABS
40.0000 mg | ORAL_TABLET | Freq: Every day | ORAL | Status: DC
  Administered 2022-06-11 – 2022-06-12 (×2): 40 mg via ORAL
  Filled 2022-06-11 (×2): qty 1

## 2022-06-11 MED ORDER — FLEET ENEMA 7-19 GM/118ML RE ENEM: 1.0000 | ENEMA | Freq: Once | RECTAL | Status: DC | PRN

## 2022-06-11 MED ORDER — DEXAMETHASONE SODIUM PHOSPHATE 10 MG/ML IJ SOLN
INTRAMUSCULAR | Status: DC | PRN
  Administered 2022-06-11: 10 mg via INTRAVENOUS

## 2022-06-11 MED ORDER — KETOROLAC TROMETHAMINE 0.5 % OP SOLN
1.0000 [drp] | Freq: Four times a day (QID) | OPHTHALMIC | Status: DC
  Administered 2022-06-11 (×2): 1 [drp] via OPHTHALMIC
  Filled 2022-06-11: qty 5

## 2022-06-11 MED ORDER — ONDANSETRON HCL 4 MG/2ML IJ SOLN
INTRAMUSCULAR | Status: DC | PRN
  Administered 2022-06-11: 4 mg via INTRAVENOUS

## 2022-06-11 MED ORDER — KETAMINE HCL 10 MG/ML IJ SOLN
INTRAMUSCULAR | Status: DC | PRN
  Administered 2022-06-11 (×2): 25 mg via INTRAVENOUS
  Administered 2022-06-11: 10 mg via INTRAVENOUS

## 2022-06-11 MED ORDER — VALACYCLOVIR HCL 500 MG PO TABS
1000.0000 mg | ORAL_TABLET | Freq: Every day | ORAL | Status: DC
  Administered 2022-06-11 – 2022-06-12 (×2): 1000 mg via ORAL
  Filled 2022-06-11 (×2): qty 2

## 2022-06-11 MED ORDER — VANCOMYCIN HCL 1250 MG/250ML IV SOLN
1250.0000 mg | Freq: Once | INTRAVENOUS | Status: AC
  Administered 2022-06-11: 1250 mg via INTRAVENOUS
  Filled 2022-06-11: qty 250

## 2022-06-11 MED ORDER — FENTANYL CITRATE (PF) 250 MCG/5ML IJ SOLN
INTRAMUSCULAR | Status: DC | PRN
  Administered 2022-06-11: 250 ug via INTRAVENOUS

## 2022-06-11 MED ORDER — SULFASALAZINE 500 MG PO TABS
500.0000 mg | ORAL_TABLET | Freq: Two times a day (BID) | ORAL | Status: DC
  Administered 2022-06-11 – 2022-06-13 (×4): 500 mg via ORAL
  Filled 2022-06-11 (×5): qty 1

## 2022-06-11 MED ORDER — ACETAMINOPHEN 500 MG PO TABS
1000.0000 mg | ORAL_TABLET | Freq: Once | ORAL | Status: AC
  Administered 2022-06-11: 1000 mg via ORAL
  Filled 2022-06-11: qty 2

## 2022-06-11 MED ORDER — VITAMIN C 500 MG PO TABS
500.0000 mg | ORAL_TABLET | Freq: Every day | ORAL | Status: DC
  Administered 2022-06-11 – 2022-06-13 (×3): 500 mg via ORAL
  Filled 2022-06-11 (×3): qty 1

## 2022-06-11 MED ORDER — SENNOSIDES-DOCUSATE SODIUM 8.6-50 MG PO TABS: 1.0000 | ORAL_TABLET | Freq: Every evening | ORAL | Status: DC | PRN

## 2022-06-11 MED ORDER — METHOCARBAMOL 1000 MG/10ML IJ SOLN: 500.0000 mg | Freq: Four times a day (QID) | INTRAVENOUS | Status: DC | PRN

## 2022-06-11 MED ORDER — FLUTICASONE FUROATE-VILANTEROL 100-25 MCG/ACT IN AEPB
1.0000 | INHALATION_SPRAY | Freq: Every day | RESPIRATORY_TRACT | Status: DC
  Administered 2022-06-12: 1 via RESPIRATORY_TRACT
  Filled 2022-06-11: qty 28

## 2022-06-11 MED ORDER — VITAMIN B-12 1000 MCG PO TABS
1000.0000 ug | ORAL_TABLET | Freq: Every day | ORAL | Status: DC
  Administered 2022-06-11 – 2022-06-13 (×3): 1000 ug via ORAL
  Filled 2022-06-11 (×3): qty 1

## 2022-06-11 MED ORDER — MIDAZOLAM HCL 2 MG/2ML IJ SOLN: 0.5000 mg | Freq: Once | INTRAMUSCULAR | Status: DC | PRN

## 2022-06-11 MED ORDER — FENTANYL CITRATE (PF) 250 MCG/5ML IJ SOLN
INTRAMUSCULAR | Status: AC
  Filled 2022-06-11: qty 5

## 2022-06-11 MED ORDER — EPHEDRINE SULFATE-NACL 50-0.9 MG/10ML-% IV SOSY
PREFILLED_SYRINGE | INTRAVENOUS | Status: DC | PRN
  Administered 2022-06-11 (×2): 10 mg via INTRAVENOUS

## 2022-06-11 MED ORDER — MIDAZOLAM HCL 2 MG/2ML IJ SOLN
INTRAMUSCULAR | Status: AC
  Filled 2022-06-11: qty 2

## 2022-06-11 MED ORDER — BISACODYL 5 MG PO TBEC: 5.0000 mg | DELAYED_RELEASE_TABLET | Freq: Every day | ORAL | Status: DC | PRN

## 2022-06-11 MED ORDER — LACTATED RINGERS IV SOLN: INTRAVENOUS | Status: DC | PRN

## 2022-06-11 MED ORDER — PHENYLEPHRINE HCL-NACL 20-0.9 MG/250ML-% IV SOLN
INTRAVENOUS | Status: DC | PRN
  Administered 2022-06-11: 70 ug/min via INTRAVENOUS

## 2022-06-11 MED ORDER — SODIUM CHLORIDE 0.9% FLUSH
3.0000 mL | Freq: Two times a day (BID) | INTRAVENOUS | Status: DC
  Administered 2022-06-11 – 2022-06-12 (×4): 3 mL via INTRAVENOUS

## 2022-06-11 MED ORDER — OXYCODONE-ACETAMINOPHEN 5-325 MG PO TABS
1.0000 | ORAL_TABLET | ORAL | Status: DC | PRN
  Administered 2022-06-11 – 2022-06-13 (×9): 2 via ORAL
  Filled 2022-06-11 (×9): qty 2

## 2022-06-11 MED ORDER — THROMBIN 20000 UNITS EX SOLR
CUTANEOUS | Status: DC | PRN
  Administered 2022-06-11: 20 mL via TOPICAL

## 2022-06-11 MED ORDER — LOSARTAN POTASSIUM 50 MG PO TABS
50.0000 mg | ORAL_TABLET | Freq: Every day | ORAL | Status: DC
  Administered 2022-06-11 – 2022-06-12 (×2): 50 mg via ORAL
  Filled 2022-06-11 (×2): qty 1

## 2022-06-11 MED ORDER — SODIUM CHLORIDE 0.9% FLUSH: 3.0000 mL | INTRAVENOUS | Status: DC | PRN

## 2022-06-11 MED ORDER — PROPOFOL 1000 MG/100ML IV EMUL
INTRAVENOUS | Status: AC
  Filled 2022-06-11: qty 200

## 2022-06-11 MED ORDER — MONTELUKAST SODIUM 10 MG PO TABS
10.0000 mg | ORAL_TABLET | Freq: Every day | ORAL | Status: DC
  Administered 2022-06-11 – 2022-06-12 (×2): 10 mg via ORAL
  Filled 2022-06-11 (×2): qty 1

## 2022-06-11 MED ORDER — ACETAMINOPHEN 650 MG RE SUPP: 650.0000 mg | RECTAL | Status: DC | PRN

## 2022-06-11 MED ORDER — KETOROLAC TROMETHAMINE 0.5 % OP SOLN
OPHTHALMIC | Status: AC
  Filled 2022-06-11: qty 5

## 2022-06-11 MED ORDER — DOCUSATE SODIUM 100 MG PO CAPS
100.0000 mg | ORAL_CAPSULE | Freq: Two times a day (BID) | ORAL | Status: DC
  Administered 2022-06-11 – 2022-06-13 (×5): 100 mg via ORAL
  Filled 2022-06-11 (×5): qty 1

## 2022-06-11 MED ORDER — VANCOMYCIN HCL 1000 MG IV SOLR
INTRAVENOUS | Status: DC | PRN
  Administered 2022-06-11: 1500 mg via INTRAVENOUS

## 2022-06-11 MED ORDER — MEPERIDINE HCL 25 MG/ML IJ SOLN: 6.2500 mg | INTRAMUSCULAR | Status: DC | PRN

## 2022-06-11 MED ORDER — MENTHOL 3 MG MT LOZG: 1.0000 | LOZENGE | OROMUCOSAL | Status: DC | PRN

## 2022-06-11 MED ORDER — LEVOTHYROXINE SODIUM 75 MCG PO TABS
75.0000 ug | ORAL_TABLET | Freq: Every day | ORAL | Status: DC
  Administered 2022-06-12 – 2022-06-13 (×2): 75 ug via ORAL
  Filled 2022-06-11 (×2): qty 1

## 2022-06-11 MED ORDER — HYDROMORPHONE HCL 1 MG/ML IJ SOLN
INTRAMUSCULAR | Status: AC
  Filled 2022-06-11: qty 0.5

## 2022-06-11 MED ORDER — DIAZEPAM 2 MG PO TABS
2.0000 mg | ORAL_TABLET | Freq: Every day | ORAL | Status: DC | PRN
  Filled 2022-06-11: qty 1

## 2022-06-11 MED ORDER — GLYCOPYRROLATE 0.2 MG/ML IJ SOLN
INTRAMUSCULAR | Status: DC | PRN
  Administered 2022-06-11: .1 mg via INTRAVENOUS

## 2022-06-11 MED ORDER — POTASSIUM CHLORIDE IN NACL 20-0.9 MEQ/L-% IV SOLN: INTRAVENOUS | Status: DC

## 2022-06-11 MED ORDER — MIDAZOLAM HCL 2 MG/2ML IJ SOLN
INTRAMUSCULAR | Status: DC | PRN
  Administered 2022-06-11: 2 mg via INTRAVENOUS

## 2022-06-11 MED ORDER — LORATADINE 10 MG PO TABS
10.0000 mg | ORAL_TABLET | Freq: Every day | ORAL | Status: DC
  Administered 2022-06-12 – 2022-06-13 (×3): 10 mg via ORAL
  Filled 2022-06-11 (×3): qty 1

## 2022-06-11 MED ORDER — ACETAMINOPHEN 325 MG PO TABS: 650.0000 mg | ORAL_TABLET | ORAL | Status: DC | PRN

## 2022-06-11 SURGICAL SUPPLY — 80 items
BAG COUNTER SPONGE SURGICOUNT (BAG) ×1 IMPLANT
BENZOIN TINCTURE PRP APPL 2/3 (GAUZE/BANDAGES/DRESSINGS) IMPLANT
BLADE CLIPPER SURG (BLADE) IMPLANT
BLADE SURG 10 STRL SS (BLADE) ×1 IMPLANT
BONE VIVIGEN FORMABLE 10CC (Bone Implant) ×1 IMPLANT
CANNULA A INSULATED (CANNULA) IMPLANT
CANNULA B INSULATED (CANNULA) IMPLANT
CLIP NEUROVISION LG (CLIP) IMPLANT
COVER BACK TABLE 80X110 HD (DRAPES) ×1 IMPLANT
COVER SURGICAL LIGHT HANDLE (MISCELLANEOUS) ×1 IMPLANT
DISSECTOR STICK (MISCELLANEOUS) IMPLANT
DRAPE C-ARM 42X72 X-RAY (DRAPES) ×1 IMPLANT
DRAPE C-ARMOR (DRAPES) ×1 IMPLANT
DRAPE POUCH INSTRU U-SHP 10X18 (DRAPES) ×1 IMPLANT
DRAPE SURG 17X23 STRL (DRAPES) ×4 IMPLANT
DURAPREP 26ML APPLICATOR (WOUND CARE) ×1 IMPLANT
ELECT BLADE 6.5 EXT (BLADE) ×1 IMPLANT
ELECT CAUTERY BLADE 6.4 (BLADE) ×1 IMPLANT
ELECT REM PT RETURN 9FT ADLT (ELECTROSURGICAL) ×1
ELECTRODE REM PT RTRN 9FT ADLT (ELECTROSURGICAL) ×1 IMPLANT
FORCEP INSUL BAYONET BPLR 6IN (INSTRUMENTS) ×1
FORCEPS BIPOLAR BAYONET STR (FORCEP) IMPLANT
FORCEPS BPLR BAYO 10IN 1.0TIP (ORTHOPEDIC DISPOSABLE SUPPLIES) IMPLANT
FORCEPS INSUL BAYONET BPLR 6IN (INSTRUMENTS) IMPLANT
GAUZE 4X4 16PLY ~~LOC~~+RFID DBL (SPONGE) ×1 IMPLANT
GAUZE SPONGE 4X4 12PLY STRL (GAUZE/BANDAGES/DRESSINGS) IMPLANT
GLOVE BIO SURGEON STRL SZ7 (GLOVE) ×2 IMPLANT
GLOVE BIO SURGEON STRL SZ8 (GLOVE) ×1 IMPLANT
GLOVE BIOGEL PI IND STRL 7.0 (GLOVE) ×1 IMPLANT
GLOVE BIOGEL PI IND STRL 8 (GLOVE) ×1 IMPLANT
GLOVE SURG ENC MOIS LTX SZ6.5 (GLOVE) ×2 IMPLANT
GOWN STRL REUS W/ TWL LRG LVL3 (GOWN DISPOSABLE) ×1 IMPLANT
GOWN STRL REUS W/ TWL XL LVL3 (GOWN DISPOSABLE) ×2 IMPLANT
GOWN STRL REUS W/TWL LRG LVL3 (GOWN DISPOSABLE) ×1
GOWN STRL REUS W/TWL XL LVL3 (GOWN DISPOSABLE) ×2
GRAFT BNE MATRIX VG FRMBL L 10 (Bone Implant) IMPLANT
IV CATH 14GX2 1/4 (CATHETERS) ×1 IMPLANT
K-WIRE  1.6X 450L (WIRE) ×1
K-WIRE 1.6X 450L (WIRE) ×1
KIT BASIN OR (CUSTOM PROCEDURE TRAY) ×1 IMPLANT
KIT TURNOVER KIT B (KITS) ×1 IMPLANT
KNIFE BOYONETTED ANNULOTOMY (MISCELLANEOUS) IMPLANT
KWIRE 1.6X 450L (WIRE) IMPLANT
MARKER SKIN DUAL TIP RULER LAB (MISCELLANEOUS) ×1 IMPLANT
MODULE EMG NDL SSEP NVM5 (NEEDLE) IMPLANT
MODULE EMG NEEDLE SSEP NVM5 (NEEDLE) ×1 IMPLANT
MODULE NVM5 NEXT GEN EMG (NEEDLE) IMPLANT
NDL HYPO 25GX1X1/2 BEV (NEEDLE) ×1 IMPLANT
NDL SPNL 18GX3.5 QUINCKE PK (NEEDLE) ×1 IMPLANT
NEEDLE HYPO 25GX1X1/2 BEV (NEEDLE) ×1 IMPLANT
NEEDLE SPNL 18GX3.5 QUINCKE PK (NEEDLE) ×1 IMPLANT
NS IRRIG 1000ML POUR BTL (IV SOLUTION) ×2 IMPLANT
PACK LAMINECTOMY ORTHO (CUSTOM PROCEDURE TRAY) ×1 IMPLANT
PACK UNIVERSAL I (CUSTOM PROCEDURE TRAY) ×1 IMPLANT
PAD ARMBOARD 7.5X6 YLW CONV (MISCELLANEOUS) ×2 IMPLANT
PROBE BALL TIP NVM5 SNG USE (BALLOONS) IMPLANT
PROBE INSULATED 8 (MISCELLANEOUS) IMPLANT
SET WALTER ACTIVATION W/DRAPE (SET/KITS/TRAYS/PACK) IMPLANT
SHIM DISC ALUMINUM (MISCELLANEOUS) IMPLANT
SPACER RISE-L 18X45 8-15MM6DEG (Spacer) IMPLANT
SPONGE INTESTINAL PEANUT (DISPOSABLE) ×2 IMPLANT
SPONGE SURGIFOAM ABS GEL 100 (HEMOSTASIS) IMPLANT
SPONGE T-LAP 4X18 ~~LOC~~+RFID (SPONGE) ×1 IMPLANT
STAPLER VISISTAT 35W (STAPLE) ×1 IMPLANT
STRIP CLOSURE SKIN 1/2X4 (GAUZE/BANDAGES/DRESSINGS) IMPLANT
SURGIFLO W/THROMBIN 8M KIT (HEMOSTASIS) IMPLANT
SUT MNCRL AB 4-0 PS2 18 (SUTURE) ×1 IMPLANT
SUT VIC AB 0 CT1 18XCR BRD 8 (SUTURE) ×1 IMPLANT
SUT VIC AB 0 CT1 8-18 (SUTURE) ×1
SUT VIC AB 1 CT1 18XCR BRD 8 (SUTURE) ×1 IMPLANT
SUT VIC AB 1 CT1 8-18 (SUTURE) ×1
SUT VIC AB 2-0 CT2 18 VCP726D (SUTURE) ×1 IMPLANT
SYR BULB IRRIG 60ML STRL (SYRINGE) ×1 IMPLANT
SYR CONTROL 10ML LL (SYRINGE) ×1 IMPLANT
TAPE CLOTH SURG 6X10 WHT LF (GAUZE/BANDAGES/DRESSINGS) IMPLANT
TOWEL GREEN STERILE (TOWEL DISPOSABLE) ×1 IMPLANT
TOWEL GREEN STERILE FF (TOWEL DISPOSABLE) ×1 IMPLANT
TRAY FOLEY MTR SLVR 16FR STAT (SET/KITS/TRAYS/PACK) ×1 IMPLANT
WATER STERILE IRR 1000ML POUR (IV SOLUTION) ×1 IMPLANT
YANKAUER SUCT BULB TIP NO VENT (SUCTIONS) ×1 IMPLANT

## 2022-06-11 NOTE — Anesthesia Preprocedure Evaluation (Signed)
Anesthesia Evaluation  Patient identified by MRN, date of birth, ID band Patient awake    Reviewed: Allergy & Precautions, NPO status , Patient's Chart, lab work & pertinent test results  Airway Mallampati: II  TM Distance: >3 FB Neck ROM: Full    Dental no notable dental hx. (+) Chipped, Dental Advisory Given   Pulmonary asthma , sleep apnea , COPD,  COPD inhaler, former smoker   Pulmonary exam normal breath sounds clear to auscultation       Cardiovascular hypertension, Pt. on medications Normal cardiovascular exam Rhythm:Regular Rate:Normal  09/2021 stress: EF 63%, no ischemia at rest or stress   Neuro/Psych  Headaches  Anxiety     TIA Neuromuscular disease (Bilatera LE weakness)    GI/Hepatic Neg liver ROS,GERD  Controlled,,Hx of UC   Endo/Other  Hypothyroidism  Morbid obesity (BMI 48.9)  Renal/GU Lab Results      Component                Value               Date                      CREATININE               0.77                05/22/2022                 K                        3.9                 05/22/2022                    Musculoskeletal   Abdominal   Peds  Hematology Lab Results      Component                Value               Date                            HGB                      13.9                05/22/2022                HCT                      43.7                05/22/2022                   PLT                      312                 05/22/2022              Anesthesia Other Findings ALL: PCN, oxycodone  Reproductive/Obstetrics                             Anesthesia Physical Anesthesia Plan  ASA: 3  Anesthesia Plan: General   Post-op Pain Management: Ketamine IV* and Dilaudid IV   Induction: Intravenous  PONV Risk Score and Plan: 4 or greater and Treatment may vary due to age or medical condition, Midazolam, Ondansetron and Dexamethasone  Airway Management  Planned: Oral ETT  Additional Equipment: None  Intra-op Plan:   Post-operative Plan: Extubation in OR  Informed Consent: I have reviewed the patients History and Physical, chart, labs and discussed the procedure including the risks, benefits and alternatives for the proposed anesthesia with the patient or authorized representative who has indicated his/her understanding and acceptance.     Dental advisory given  Plan Discussed with:   Anesthesia Plan Comments:         Anesthesia Quick Evaluation

## 2022-06-11 NOTE — Op Note (Addendum)
PATIENT NAME: Julie Hess   MEDICAL RECORD NO.:   [id number]    DATE OF BIRTH: April 03, 1971   DATE OF PROCEDURE: 06/11/2022                               OPERATIVE REPORT   PREOPERATIVE DIAGNOSES: 1.  Bilateral lower extremity pain and weakness 2.  S/p previous lumbar fusion, extending up to L3 3.  Severe L2/3 spinal stenosis 4. 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 Spondylolisthesis, L2/3 (M43.16)   POSTOPERATIVE DIAGNOSES: 1.  Bilateral lower extremity pain and weakness 2.  S/p previous lumbar fusion, extending up to L3 3.  Severe L2/3 spinal stenosis 4. 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 Spondylolisthesis, L2/3 (M43.16)   PROCEDURE:  1.  Right-sided lateral interbody fusion, L2/3  via a direct lateral retroperitoneal approach. 2.  Insertion of interbody device x1 (18 mm x 45 mm Globus expandable intervertebral spacer). 3.  Use of morselized allograft -- ViviGen.   4.  Intraoperative use of fluoroscopy.   SURGEON:  Phylliss Bob, MD   ASSISTANT:  [name] PA-C.   ANESTHESIA:  General endotracheal anesthesia.   COMPLICATIONS:  None.   DISPOSITION:  Stable.   ESTIMATED BLOOD LOSS:  Minimal.   INDICATIONS:  Briefly, Julie Hess is a very pleasant 51 year old female who did present to me with progressive pain and weakness in the bilateral legs.  She is noted to be status post a previous fusion, as noted above.  Her MRI did reveal the findings outlined above. Given her ongoing pain and dysfunction, we did discuss proceeding with the procedure reflected above.  The patient did wish to proceed, after a full understanding of the risks and benefits of surgery.   DESCRIPTION OF PROCEDURE:  On 06/11/2022, the patient was brought to surgery and general endotracheal anesthesia was administered.  The patient was placed in the lateral decubitus position, with the left side up.  Neurologic monitoring leads  were placed by the monitoring technician.  The patient's torso and lower extremities were secured to the bed.  The patient's hips and knees were flexed in order to lessen the tension on the psoas musculature.  The right flank was then prepped and draped in the usual sterile fashion.  The bed was flexed, in order to optimize exposure to the L2/3 intervertebral space.  After a timeout procedure was performed, a right-sided transverse incision was made over the right flank overlying the L2/3 intervertebral space.  The retroperitoneal space was encountered, after dissection through the oblique musculature.  The peritoneum was bluntly swept anteriorly, and the psoas was readily identified.  I did use a series of dilators to dock over the L2/3 intervertebral space.  I did use neurologic monitoring while placing the  dilators, in order to ensure that there were no neurologic structures in the immediate vicinity of the dilators.  The lumbar plexus was noted to be posterior.  A self-retaining retractor was placed, and was attached to a rigid arm.  The retractor was very gently dilated and a shim was placed into the L2/3 intervertebral space.  I then used a knife to perform an annulotomy at the lateral aspect of the L2/3 intervertebral space.  I then used a series of curettes and pituitary rongeurs in order to perform a thorough and complete L2/3 intervertebral diskectomy.  The contralateral annulus was released.  I then placed a series of  intervertebral spacer trials, and I did feel that an 18 mm x 45 mm spacer would be the most appropriate fit.  The appropriate spacer was then packed with ViviGen and tamped into position.  At this point, it was expanded to approximately 11 mm in height. I was very pleased with the final resting position of the intervertebral spacer.  Excellent height restoration was noted.  The retractor was then removed.  I was very pleased with the final AP and lateral fluoroscopic images and the  excellent restoration of disk height identified on both AP and lateral images.  At this point, the wound was copiously irrigated.  The fascia, internal, and external oblique musculature was closed using #1 Vicryl.  The subcutaneous layer was closed using 2-0 Vicryl and the skin was closed using 4-0 Monocryl.     Of note, I did use neurologic monitoring throughout the entire surgery, and there was no sustained EMG activity noted throughout the entire surgery. All instrument counts were correct at the termination of the procedure.   Of note, [name] was my assistant throughout surgery, and did aid in retraction, placement of the hardware, suctioning, and closure.   Phylliss Bob, MD

## 2022-06-11 NOTE — Anesthesia Procedure Notes (Signed)
Procedure Name: Intubation Date/Time: 06/11/2022 8:48 AM  Performed by: Elvin So, CRNAPre-anesthesia Checklist: Patient identified, Emergency Drugs available, Suction available and Patient being monitored Patient Re-evaluated:Patient Re-evaluated prior to induction Oxygen Delivery Method: Circle System Utilized Preoxygenation: Pre-oxygenation with 100% oxygen Induction Type: IV induction Ventilation: Mask ventilation without difficulty Laryngoscope Size: Mac and 3 Grade View: Grade II Tube type: Oral Tube size: 7.0 mm Number of attempts: 1 Airway Equipment and Method: Stylet and Oral airway Placement Confirmation: ETT inserted through vocal cords under direct vision, positive ETCO2 and breath sounds checked- equal and bilateral Secured at: 22 cm Tube secured with: Tape Dental Injury: Teeth and Oropharynx as per pre-operative assessment

## 2022-06-11 NOTE — H&P (Signed)
PREOPERATIVE H&P  Chief Complaint: Bilateral thigh pain and weakness  HPI: Julie Hess is a 51 y.o. female who presents with ongoing pain and weakness in the bilateral legs  MRI reveals instability and stenosis at L2/3, the level above the patient's previous fusion  Patient has failed multiple forms of conservative care and continues to have pain (see office notes for additional details regarding the patient's full course of treatment)  Past Medical History:  Diagnosis Date   Anemia    with ulcerative colitis   Anxiety    Asthma    very mild per pt   Complication of anesthesia    Hard time waking up after C-Section 1997, but tolerated anesthesia since then no issues   Dyspnea    with activity   GERD (gastroesophageal reflux disease)    Hyperlipidemia    Hypertension    Hypothyroidism    Migraines    migraines   Sleep apnea    uses cpap   Spinal headache    Thyroid disease    TIA (transient ischemic attack) 08/30/2021   TIA versus complicated migraine, hospitalized 08/31/21 for work-up at [facility]   Ulcerative colitis, chronic [facility])    Past Surgical History:  Procedure Laterality Date   ABDOMINAL HYSTERECTOMY  2002   ANAL FISSURECTOMY  12/2021   ANTERIOR CERVICAL DECOMPRESSION/DISCECTOMY FUSION 4 LEVEL/HARDWARE REMOVAL N/A 08/26/2018   Procedure: ANTERIOR CERVICAL DECOMPRESSION FUSION, CERVICAL 4-5, CERVICAL 5-6, CERVICAL 6-7 WITH INSTRUMENTATION AND ALLOGRAFT;  Surgeon: Estill Bamberg, MD;  Location: [facility] OR;  Service: Orthopedics;  Laterality: N/A;   ANTERIOR LAT LUMBAR FUSION Left 03/30/2019   Procedure: LEFT LATERAL LUMBAR THREE-FOUR INTERBODY FUSION WITH  ALLOGRAFT;  Surgeon: Estill Bamberg, MD;  Location: [facility] OR;  Service: Orthopedics;  Laterality: Left;  LEFT LATERAL LUMBAR THREE-FOUR INTERBODY FUSION WITH  ALLOGRAFT   APPENDECTOMY     BACK SURGERY  2019   lumbar fusion   BACK SURGERY     discetomy   CESAREAN SECTION  1997    CHOLECYSTECTOMY     SPINE SURGERY  08/11/1998   lumbar   Social History   Socioeconomic History   Marital status: Married    Spouse name: Susette Seminara   Number of children: 1   Years of education: Not on file   Highest education level: Not on file  Occupational History   Not on file  Tobacco Use   Smoking status: Former    Types: Cigarettes    Quit date: 08/11/2004    Years since quitting: 17.8   Smokeless tobacco: Never  Vaping Use   Vaping Use: Never used  Substance and Sexual Activity   Alcohol use: Yes    Comment: occasional   Drug use: No   Sexual activity: Not on file  Other Topics Concern   Not on file  Social History Narrative   Not on file   Social Determinants of Health   Financial Resource Strain: Not on file  Food Insecurity: Not on file  Transportation Needs: Not on file  Physical Activity: Not on file  Stress: Not on file  Social Connections: Not on file   History reviewed. No pertinent family history. Allergies  Allergen Reactions   Penicillins Hives, Shortness Of Breath and Other (See Comments)    DID THE REACTION INVOLVE: Swelling of the face/tongue/throat, SOB, or low BP? #  #  #  YES  #  #  #  Sudden or severe rash/hives, skin peeling, or the  inside of the mouth or nose? No Did it require medical treatment? No When did it last happen?  2016    Other Other (See Comments)    Anti-depressants   Oxycodone Nausea Only    Prefers Hydrocodone - Oxycodone causes nausea and dizziness   Shellfish Allergy Hives, Itching and Swelling   Prior to Admission medications   Medication Sig Start Date End Date Taking? Authorizing Provider  albuterol (VENTOLIN HFA) 108 (90 Base) MCG/ACT inhaler Inhale 1-2 puffs into the lungs every 6 (six) hours as needed for wheezing or shortness of breath.   Yes [provider]  BREO ELLIPTA 100-25 MCG/ACT AEPB Inhale 1 puff into the lungs daily. 05/18/22  Yes [provider]  busPIRone (BUSPAR) 15 MG  tablet Take 15 mg by mouth 2 (two) times daily.   Yes [provider]  cholecalciferol (VITAMIN D3) 25 MCG (1000 UNIT) tablet Take 1,000 Units by mouth daily.   Yes [provider]  cyanocobalamin 1000 MCG tablet Take 1,000 mcg by mouth daily.   Yes [provider]  diazepam (VALIUM) 2 MG tablet Take 2 mg by mouth daily as needed for anxiety. 02/19/22  Yes [provider]  estradiol (CLIMARA) 0.06 MG/24HR Place 1 patch onto the skin every 7 (seven) days.   Yes [provider]  fexofenadine (ALLEGRA) 180 MG tablet Take 180 mg by mouth daily.   Yes [provider]  furosemide (LASIX) 20 MG tablet Take 20 mg by mouth daily. 01/09/22  Yes [provider]  hyoscyamine (LEVSIN SL) 0.125 MG SL tablet Take 0.125 mg by mouth every 6 (six) hours as needed for cramping. 05/05/22  Yes [provider]  levothyroxine (SYNTHROID, LEVOTHROID) 75 MCG tablet Take 75 mcg by mouth daily before breakfast.  07/07/15  Yes [provider]  losartan (COZAAR) 50 MG tablet Take 50 mg by mouth at bedtime.   Yes [provider]  montelukast (SINGULAIR) 10 MG tablet Take 10 mg by mouth at bedtime. 04/15/22  Yes [provider]  pantoprazole (PROTONIX) 40 MG tablet Take 40 mg by mouth 2 (two) times daily.   Yes [provider]  pravastatin (PRAVACHOL) 40 MG tablet Take 40 mg by mouth at bedtime.   Yes [provider]  sulfaSALAzine (AZULFIDINE) 500 MG tablet Take 500 mg by mouth 2 (two) times daily. 05/08/22  Yes [provider]  valACYclovir (VALTREX) 1000 MG tablet Take 1,000 mg by mouth at bedtime. 05/17/22  Yes [provider]  vitamin C (ASCORBIC ACID) 500 MG tablet Take 500 mg by mouth daily.   Yes [provider]  benzonatate (TESSALON) 100 MG capsule Take 1 capsule (100 mg total) by mouth every 8 (eight) hours. Patient not taking: Reported on 05/20/2022 08/17/21   Drenda Freeze, MD   diazepam (VALIUM) 5 MG tablet Take 1 tablet (5 mg total) by mouth every 6 (six) hours as needed for muscle spasms. Patient not taking: Reported on 05/20/2022 04/01/19   Justice Britain, PA-C  HYDROcodone-acetaminophen [facility]) 10-325 MG tablet Take 1-2 tablets by mouth every 4 (four) hours as needed for moderate pain or severe pain. Patient not taking: Reported on 05/20/2022 04/01/19   Justice Britain, PA-C  NURTEC 75 MG TBDP Take 1 tablet by mouth daily as needed for migraine. 01/20/22   [provider]  predniSONE (DELTASONE) 20 MG tablet Take 60 mg daily x 2 days then 40 mg daily x 2 days then 20 mg daily x  2 days Patient not taking: Reported on 05/20/2022 08/17/21   Charlynne Pander, MD     All other systems have been reviewed and were otherwise negative with the exception of those mentioned in the HPI and as above.  Physical Exam: Vitals:   06/11/22 0706  BP: 133/87  Pulse: 85  Resp: 18  Temp: 97.6 F (36.4 C)  SpO2: 99%    Body mass index is 48.92 kg/m.  General: Alert, no acute distress Cardiovascular: No pedal edema Respiratory: No cyanosis, no use of accessory musculature Skin: No lesions in the area of chief complaint Neurologic: Sensation intact distally Psychiatric: Patient is competent for consent with normal mood and affect Lymphatic: No axillary or cervical lymphadenopathy   Assessment/Plan: Progressive bilateral thigh pain and weakness, secondary to the patient's severe spinal stenosis at L2-3 and instability  Plan for Procedure(s): RIGHT-SIDED LUMBAR 2- LUMBAR 3 LATERAL INTERBODY FUSION WITH INSTRUMENTATION AND ALLOGRAFT   Jackelyn Hoehn, MD 06/11/2022 8:10 AM

## 2022-06-11 NOTE — Transfer of Care (Signed)
Immediate Anesthesia Transfer of Care Note  Patient: [name]  Procedure(s) Performed: RIGHT-SIDED LUMBAR TWO- LUMBAR THREE LATERAL INTERBODY FUSION WITH INSTRUMENTATION AND ALLOGRAFT (Right)  Patient Location: PACU  Anesthesia Type:General  Level of Consciousness: awake, alert  and oriented  Airway & Oxygen Therapy: Patient Spontanous Breathing and Patient connected to nasal cannula oxygen  Post-op Assessment: Report given to RN, Post -op Vital signs reviewed and stable and Patient moving all extremities  Post vital signs: Reviewed and stable  Last Vitals:  Vitals Value Taken Time  BP 108/58 06/11/22 1207  Temp    Pulse 114 06/11/22 1214  Resp 15 06/11/22 1214  SpO2 93 % 06/11/22 1214  Vitals shown include unvalidated device data.  Last Pain:  Vitals:   06/11/22 0717  TempSrc:   PainSc: 8       Patients Stated Pain Goal: 4 (75/91/63 8466)  Complications: No notable events documented.

## 2022-06-11 NOTE — Anesthesia Postprocedure Evaluation (Signed)
Anesthesia Post Note  Patient: Julie Hess  Procedure(s) Performed: RIGHT-SIDED LUMBAR TWO- LUMBAR THREE LATERAL INTERBODY FUSION WITH INSTRUMENTATION AND ALLOGRAFT (Right)     Patient location during evaluation: PACU Anesthesia Type: General Level of consciousness: awake and alert, patient cooperative and oriented Pain management: pain level controlled Vital Signs Assessment: post-procedure vital signs reviewed and stable Respiratory status: spontaneous breathing, nonlabored ventilation and respiratory function stable Cardiovascular status: blood pressure returned to baseline and stable Postop Assessment: no apparent nausea or vomiting Anesthetic complications: no Comments: Pt scratched her L eye in PACU, Ketorolac drops given    No notable events documented.  Last Vitals:  Vitals:   06/11/22 0706 06/11/22 1215  BP: 133/87 (!) 108/58  Pulse: 85 (!) 111  Resp: 18 (!) 22  Temp: 36.4 C 36.5 C  SpO2: 99% 92%    Last Pain:  Vitals:   06/11/22 0717  TempSrc:   PainSc: 8                  Corianna Avallone,E. Elina Streng

## 2022-06-12 ENCOUNTER — Encounter (HOSPITAL_COMMUNITY): Payer: Self-pay | Admitting: Orthopedic Surgery

## 2022-06-12 ENCOUNTER — Inpatient Hospital Stay (HOSPITAL_COMMUNITY): Payer: BC Managed Care – PPO | Admitting: Physician Assistant

## 2022-06-12 ENCOUNTER — Inpatient Hospital Stay (HOSPITAL_COMMUNITY): Payer: BC Managed Care – PPO

## 2022-06-12 ENCOUNTER — Inpatient Hospital Stay (HOSPITAL_COMMUNITY)
Admission: RE | Admit: 2022-06-12 | Payer: BC Managed Care – PPO | Source: Home / Self Care | Admitting: Orthopedic Surgery

## 2022-06-12 ENCOUNTER — Encounter (HOSPITAL_COMMUNITY)
Admission: RE | Disposition: A | Discharge status: Home / Self Care | Payer: Self-pay | Source: Home / Self Care | Attending: Orthopedic Surgery

## 2022-06-12 SURGERY — POSTERIOR LUMBAR FUSION 1 LEVEL
Anesthesia: General | Site: Spine Lumbar

## 2022-06-12 MED ORDER — PROPOFOL 10 MG/ML IV BOLUS
INTRAVENOUS | Status: DC | PRN
  Administered 2022-06-12: 160 mg via INTRAVENOUS

## 2022-06-12 MED ORDER — SUGAMMADEX SODIUM 200 MG/2ML IV SOLN
INTRAVENOUS | Status: DC | PRN
  Administered 2022-06-12: 200 mg via INTRAVENOUS

## 2022-06-12 MED ORDER — BUPIVACAINE-EPINEPHRINE (PF) 0.25% -1:200000 IJ SOLN
INTRAMUSCULAR | Status: DC | PRN
  Administered 2022-06-12: 40 mL

## 2022-06-12 MED ORDER — BUPIVACAINE-EPINEPHRINE (PF) 0.25% -1:200000 IJ SOLN
INTRAMUSCULAR | Status: AC
  Filled 2022-06-12: qty 30

## 2022-06-12 MED ORDER — POVIDONE-IODINE 7.5 % EX SOLN: Freq: Once | CUTANEOUS | Status: DC

## 2022-06-12 MED ORDER — LIDOCAINE 2% (20 MG/ML) 5 ML SYRINGE
INTRAMUSCULAR | Status: DC | PRN
  Administered 2022-06-12: 40 mg via INTRAVENOUS

## 2022-06-12 MED ORDER — BUPIVACAINE-EPINEPHRINE 0.25% -1:200000 IJ SOLN
INTRAMUSCULAR | Status: DC | PRN
  Administered 2022-06-12: 7 mL

## 2022-06-12 MED ORDER — AMISULPRIDE (ANTIEMETIC) 5 MG/2ML IV SOLN: 10.0000 mg | Freq: Once | INTRAVENOUS | Status: DC | PRN

## 2022-06-12 MED ORDER — VANCOMYCIN HCL 1000 MG IV SOLR
INTRAVENOUS | Status: DC | PRN
  Administered 2022-06-12: 1500 mg via INTRAVENOUS

## 2022-06-12 MED ORDER — PHENYLEPHRINE HCL-NACL 20-0.9 MG/250ML-% IV SOLN
INTRAVENOUS | Status: DC | PRN
  Administered 2022-06-12: 25 ug/min via INTRAVENOUS

## 2022-06-12 MED ORDER — METHOCARBAMOL 500 MG PO TABS: 500.0000 mg | ORAL_TABLET | Freq: Four times a day (QID) | ORAL | 2 refills | Status: DC | PRN

## 2022-06-12 MED ORDER — ONDANSETRON HCL 4 MG/2ML IJ SOLN
INTRAMUSCULAR | Status: AC
  Filled 2022-06-12: qty 2

## 2022-06-12 MED ORDER — METHYLPREDNISOLONE ACETATE 40 MG/ML IJ SUSP
INTRAMUSCULAR | Status: AC
  Filled 2022-06-12: qty 1

## 2022-06-12 MED ORDER — HYDROMORPHONE HCL 1 MG/ML IJ SOLN
INTRAMUSCULAR | Status: AC
  Filled 2022-06-12: qty 1

## 2022-06-12 MED ORDER — OXYCODONE HCL 5 MG/5ML PO SOLN: 5.0000 mg | Freq: Once | ORAL | Status: DC | PRN

## 2022-06-12 MED ORDER — EPHEDRINE 5 MG/ML INJ
INTRAVENOUS | Status: AC
  Filled 2022-06-12: qty 5

## 2022-06-12 MED ORDER — OXYCODONE-ACETAMINOPHEN 5-325 MG PO TABS: 1.0000 | ORAL_TABLET | ORAL | 0 refills | Status: DC | PRN

## 2022-06-12 MED ORDER — THROMBIN 20000 UNITS EX SOLR
CUTANEOUS | Status: AC
  Filled 2022-06-12: qty 20000

## 2022-06-12 MED ORDER — LACTATED RINGERS IV SOLN: INTRAVENOUS | Status: DC | PRN

## 2022-06-12 MED ORDER — HEMOSTATIC AGENTS (NO CHARGE) OPTIME
TOPICAL | Status: DC | PRN
  Administered 2022-06-12: 1 via TOPICAL

## 2022-06-12 MED ORDER — PROPOFOL 10 MG/ML IV BOLUS
INTRAVENOUS | Status: AC
  Filled 2022-06-12: qty 20

## 2022-06-12 MED ORDER — FENTANYL CITRATE (PF) 250 MCG/5ML IJ SOLN
INTRAMUSCULAR | Status: DC | PRN
  Administered 2022-06-12: 150 ug via INTRAVENOUS
  Administered 2022-06-12: 100 ug via INTRAVENOUS

## 2022-06-12 MED ORDER — KETAMINE HCL 50 MG/5ML IJ SOSY
PREFILLED_SYRINGE | INTRAMUSCULAR | Status: AC
  Filled 2022-06-12: qty 5

## 2022-06-12 MED ORDER — METHYLENE BLUE 1 % INJ SOLN
INTRAVENOUS | Status: AC
  Filled 2022-06-12: qty 10

## 2022-06-12 MED ORDER — ACETAMINOPHEN 10 MG/ML IV SOLN: 1000.0000 mg | Freq: Once | INTRAVENOUS | Status: DC | PRN

## 2022-06-12 MED ORDER — LIDOCAINE 2% (20 MG/ML) 5 ML SYRINGE
INTRAMUSCULAR | Status: AC
  Filled 2022-06-12: qty 5

## 2022-06-12 MED ORDER — ROCURONIUM BROMIDE 10 MG/ML (PF) SYRINGE
PREFILLED_SYRINGE | INTRAVENOUS | Status: AC
  Filled 2022-06-12: qty 10

## 2022-06-12 MED ORDER — THROMBIN 20000 UNITS EX SOLR
CUTANEOUS | Status: DC | PRN
  Administered 2022-06-12: 20000 [IU] via TOPICAL

## 2022-06-12 MED ORDER — HYDROMORPHONE HCL 1 MG/ML IJ SOLN
0.2500 mg | INTRAMUSCULAR | Status: DC | PRN
  Administered 2022-06-12 (×2): 0.5 mg via INTRAVENOUS

## 2022-06-12 MED ORDER — DIAZEPAM 5 MG PO TABS: 2.5000 mg | ORAL_TABLET | Freq: Every day | ORAL | Status: DC | PRN

## 2022-06-12 MED ORDER — BUPIVACAINE LIPOSOME 1.3 % IJ SUSP
INTRAMUSCULAR | Status: AC
  Filled 2022-06-12: qty 20

## 2022-06-12 MED ORDER — FENTANYL CITRATE (PF) 250 MCG/5ML IJ SOLN
INTRAMUSCULAR | Status: AC
  Filled 2022-06-12: qty 5

## 2022-06-12 MED ORDER — ONDANSETRON HCL 4 MG/2ML IJ SOLN
INTRAMUSCULAR | Status: DC | PRN
  Administered 2022-06-12: 4 mg via INTRAVENOUS

## 2022-06-12 MED ORDER — ONDANSETRON HCL 4 MG/2ML IJ SOLN: 4.0000 mg | Freq: Once | INTRAMUSCULAR | Status: DC | PRN

## 2022-06-12 MED ORDER — MIDAZOLAM HCL 2 MG/2ML IJ SOLN
INTRAMUSCULAR | Status: DC | PRN
  Administered 2022-06-12: 2 mg via INTRAVENOUS

## 2022-06-12 MED ORDER — MIDAZOLAM HCL 2 MG/2ML IJ SOLN
INTRAMUSCULAR | Status: AC
  Filled 2022-06-12: qty 2

## 2022-06-12 MED ORDER — 0.9 % SODIUM CHLORIDE (POUR BTL) OPTIME
TOPICAL | Status: DC | PRN
  Administered 2022-06-12: 1000 mL

## 2022-06-12 MED ORDER — ROCURONIUM BROMIDE 10 MG/ML (PF) SYRINGE
PREFILLED_SYRINGE | INTRAVENOUS | Status: DC | PRN
  Administered 2022-06-12: 50 mg via INTRAVENOUS
  Administered 2022-06-12: 20 mg via INTRAVENOUS

## 2022-06-12 MED ORDER — OXYCODONE HCL 5 MG PO TABS: 5.0000 mg | ORAL_TABLET | Freq: Once | ORAL | Status: DC | PRN

## 2022-06-12 MED ORDER — EPHEDRINE SULFATE-NACL 50-0.9 MG/10ML-% IV SOSY
PREFILLED_SYRINGE | INTRAVENOUS | Status: DC | PRN
  Administered 2022-06-12 (×5): 5 mg via INTRAVENOUS

## 2022-06-12 SURGICAL SUPPLY — 84 items
BAG COUNTER SPONGE SURGICOUNT (BAG) ×1 IMPLANT
BENZOIN TINCTURE PRP APPL 2/3 (GAUZE/BANDAGES/DRESSINGS) ×1 IMPLANT
BIT DRILL NEURO 2X3.1 SFT TUCH (MISCELLANEOUS) IMPLANT
BLADE CLIPPER SURG (BLADE) IMPLANT
BONE VIVIGEN FORMABLE 5.4CC (Bone Implant) ×1 IMPLANT
BUR PRESCISION 1.7 ELITE (BURR) ×1 IMPLANT
BUR ROUND FLUTED 5 RND (BURR) ×1 IMPLANT
BUR ROUND PRECISION 4.0 (BURR) IMPLANT
BUR SABER RD CUTTING 3.0 (BURR) IMPLANT
CNTNR URN SCR LID CUP LEK RST (MISCELLANEOUS) ×1 IMPLANT
CONT SPEC 4OZ STRL OR WHT (MISCELLANEOUS) ×1
COVER MAYO STAND STRL (DRAPES) ×2 IMPLANT
COVER SURGICAL LIGHT HANDLE (MISCELLANEOUS) ×1 IMPLANT
DRAIN CHANNEL 15F RND FF W/TCR (WOUND CARE) IMPLANT
DRAPE C-ARM 42X72 X-RAY (DRAPES) ×1 IMPLANT
DRAPE C-ARMOR (DRAPES) IMPLANT
DRAPE POUCH INSTRU U-SHP 10X18 (DRAPES) ×1 IMPLANT
DRAPE SURG 17X23 STRL (DRAPES) ×4 IMPLANT
DRILL NEURO 2X3.1 SOFT TOUCH (MISCELLANEOUS) ×1
DURAPREP 26ML APPLICATOR (WOUND CARE) ×1 IMPLANT
ELECT BLADE 4.0 EZ CLEAN MEGAD (MISCELLANEOUS) ×1
ELECT CAUTERY BLADE 6.4 (BLADE) ×1 IMPLANT
ELECT REM PT RETURN 9FT ADLT (ELECTROSURGICAL) ×1
ELECTRODE BLDE 4.0 EZ CLN MEGD (MISCELLANEOUS) ×1 IMPLANT
ELECTRODE REM PT RTRN 9FT ADLT (ELECTROSURGICAL) ×1 IMPLANT
EVACUATOR SILICONE 100CC (DRAIN) IMPLANT
FILTER STRAW FLUID ASPIR (MISCELLANEOUS) ×1 IMPLANT
GAUZE 4X4 16PLY ~~LOC~~+RFID DBL (SPONGE) ×1 IMPLANT
GAUZE SPONGE 4X4 12PLY STRL (GAUZE/BANDAGES/DRESSINGS) ×1 IMPLANT
GLOVE BIO SURGEON STRL SZ7 (GLOVE) ×1 IMPLANT
GLOVE BIO SURGEON STRL SZ8 (GLOVE) ×1 IMPLANT
GLOVE BIOGEL PI IND STRL 7.0 (GLOVE) ×1 IMPLANT
GLOVE BIOGEL PI IND STRL 8 (GLOVE) ×1 IMPLANT
GLOVE SURG ENC MOIS LTX SZ6.5 (GLOVE) ×1 IMPLANT
GOWN STRL REUS W/ TWL LRG LVL3 (GOWN DISPOSABLE) ×2 IMPLANT
GOWN STRL REUS W/ TWL XL LVL3 (GOWN DISPOSABLE) ×1 IMPLANT
GOWN STRL REUS W/TWL LRG LVL3 (GOWN DISPOSABLE) ×2
GOWN STRL REUS W/TWL XL LVL3 (GOWN DISPOSABLE) ×1
GRAFT BNE MATRIX VG FRMBL MD 5 (Bone Implant) IMPLANT
IV CATH 14GX2 1/4 (CATHETERS) ×1 IMPLANT
KIT BASIN OR (CUSTOM PROCEDURE TRAY) ×1 IMPLANT
KIT POSITION SURG JACKSON T1 (MISCELLANEOUS) ×1 IMPLANT
KIT TURNOVER KIT B (KITS) ×1 IMPLANT
MARKER SKIN DUAL TIP RULER LAB (MISCELLANEOUS) ×2 IMPLANT
NDL 18GX1X1/2 (RX/OR ONLY) (NEEDLE) ×1 IMPLANT
NDL 22X1.5 STRL (OR ONLY) (MISCELLANEOUS) ×2 IMPLANT
NDL HYPO 25GX1X1/2 BEV (NEEDLE) ×1 IMPLANT
NDL SPNL 18GX3.5 QUINCKE PK (NEEDLE) ×2 IMPLANT
NEEDLE 18GX1X1/2 (RX/OR ONLY) (NEEDLE) ×1 IMPLANT
NEEDLE 22X1.5 STRL (OR ONLY) (MISCELLANEOUS) ×2 IMPLANT
NEEDLE HYPO 25GX1X1/2 BEV (NEEDLE) ×1 IMPLANT
NEEDLE SPNL 18GX3.5 QUINCKE PK (NEEDLE) ×2 IMPLANT
NS IRRIG 1000ML POUR BTL (IV SOLUTION) ×1 IMPLANT
PACK LAMINECTOMY ORTHO (CUSTOM PROCEDURE TRAY) ×1 IMPLANT
PACK UNIVERSAL I (CUSTOM PROCEDURE TRAY) ×1 IMPLANT
PAD ARMBOARD 7.5X6 YLW CONV (MISCELLANEOUS) ×2 IMPLANT
PATTIES SURGICAL .5 X1 (DISPOSABLE) ×1 IMPLANT
PATTIES SURGICAL .5X1.5 (GAUZE/BANDAGES/DRESSINGS) ×1 IMPLANT
ROD EXPEDIUM PRE BENT 55MM (Rod) IMPLANT
SCREW SET SINGLE INNER (Screw) IMPLANT
SCREW VIPER CORTICAL FIX 6X40 (Screw) IMPLANT
SPOGE SURGIFLO 8M (HEMOSTASIS)
SPONGE INTESTINAL PEANUT (DISPOSABLE) ×1 IMPLANT
SPONGE SURGIFLO 8M (HEMOSTASIS) IMPLANT
SPONGE SURGIFOAM ABS GEL 100 (HEMOSTASIS) ×1 IMPLANT
STRIP CLOSURE SKIN 1/2X4 (GAUZE/BANDAGES/DRESSINGS) ×2 IMPLANT
SURGIFLO W/THROMBIN 8M KIT (HEMOSTASIS) IMPLANT
SUT MNCRL AB 4-0 PS2 18 (SUTURE) ×1 IMPLANT
SUT VIC AB 0 CT1 18XCR BRD 8 (SUTURE) ×1 IMPLANT
SUT VIC AB 0 CT1 8-18 (SUTURE) ×1
SUT VIC AB 1 CT1 18XCR BRD 8 (SUTURE) ×1 IMPLANT
SUT VIC AB 1 CT1 8-18 (SUTURE) ×1
SUT VIC AB 2-0 CT2 18 VCP726D (SUTURE) ×1 IMPLANT
SYR 20ML LL LF (SYRINGE) ×2 IMPLANT
SYR BULB IRRIG 60ML STRL (SYRINGE) ×1 IMPLANT
SYR CONTROL 10ML LL (SYRINGE) ×2 IMPLANT
SYR TB 1ML LUER SLIP (SYRINGE) ×1 IMPLANT
TAP EXPEDIUM DL 4.35 (INSTRUMENTS) IMPLANT
TAP EXPEDIUM DL 5.0 (INSTRUMENTS) IMPLANT
TAP EXPEDIUM DL 6.0 (INSTRUMENTS) IMPLANT
TAPE CLOTH SURG 4X10 WHT LF (GAUZE/BANDAGES/DRESSINGS) IMPLANT
TRAY FOLEY MTR SLVR 16FR STAT (SET/KITS/TRAYS/PACK) ×1 IMPLANT
WATER STERILE IRR 1000ML POUR (IV SOLUTION) ×1 IMPLANT
YANKAUER SUCT BULB TIP NO VENT (SUCTIONS) ×1 IMPLANT

## 2022-06-12 NOTE — Anesthesia Procedure Notes (Addendum)
Procedure Name: Intubation Date/Time: 06/12/2022 7:43 AM  Performed by: Michele Rockers, CRNAPre-anesthesia Checklist: Patient identified, Patient being monitored, Timeout performed, Emergency Drugs available and Suction available Patient Re-evaluated:Patient Re-evaluated prior to induction Oxygen Delivery Method: Circle System Utilized Preoxygenation: Pre-oxygenation with 100% oxygen Induction Type: IV induction Ventilation: Mask ventilation without difficulty, Two handed mask ventilation required and Oral airway inserted - appropriate to patient size Laryngoscope Size: Sabra Heck and 2 Grade View: Grade I Tube type: Oral Tube size: 7.0 mm Number of attempts: 1 Airway Equipment and Method: Stylet Placement Confirmation: ETT inserted through vocal cords under direct vision, positive ETCO2 and breath sounds checked- equal and bilateral Secured at: 21 cm Tube secured with: Tape Dental Injury: Teeth and Oropharynx as per pre-operative assessment

## 2022-06-12 NOTE — H&P (Signed)
Patient tolerated stage one of her procedure yesterday well.  We will proceed today with stage two (an L2-3 decompression and fusion, with instrumentation)

## 2022-06-12 NOTE — Progress Notes (Signed)
Pt uninterested in wearing CPAP. States she doesn't wear it at home and is feeling nauseous. Told pt to notify RN to call RT if she feels better and/or changes her mind.

## 2022-06-12 NOTE — Transfer of Care (Signed)
Immediate Anesthesia Transfer of Care Note  Patient: Orean Giarratano  Procedure(s) Performed: LUMBAR TWO THROUGH LUMBAR THREE DECOMPRESSION AND POSTERIOR SPINAL FUSION WITH INSTRUMENTATION AND ALLOGRAFT (Spine Lumbar)  Patient Location: PACU  Anesthesia Type:General  Level of Consciousness: awake, alert , oriented, patient cooperative, and responds to stimulation  Airway & Oxygen Therapy: Patient Spontanous Breathing and Patient connected to nasal cannula oxygen  Post-op Assessment: Report given to RN and Post -op Vital signs reviewed and stable  Post vital signs: Reviewed and stable  Last Vitals:  Vitals Value Taken Time  BP 116/59 06/12/22 1020  Temp 36.5 C 06/12/22 1020  Pulse 97 06/12/22 1022  Resp 16 06/12/22 1022  SpO2 97 % 06/12/22 1022  Vitals shown include unvalidated device data.  Last Pain:  Vitals:   06/12/22 0448  TempSrc: Oral  PainSc:       Patients Stated Pain Goal: 3 (90/93/11 2162)  Complications: No notable events documented.

## 2022-06-12 NOTE — Op Note (Addendum)
PATIENT NAME: Julie Hess   MEDICAL RECORD NO.:   [id number]    DATE OF BIRTH: 1971-06-04   DATE OF PROCEDURE: 06/12/2022                                OPERATIVE REPORT     PREOPERATIVE DIAGNOSES: 1.  Status post L2/3 anterior interbody fusion on 06/11/2022, requiring a posterior fusion and decompression with instrumentation 2.  Bilateral lower extremity pain and weakness 3.  S/p previous lumbar fusion, extending up to L3 4.  Severe L2/3 spinal stenosis 5. 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 Spondylolisthesis, L2/3 (M43.16)   POSTOPERATIVE DIAGNOSES: 1.  Status post L2/3 anterior interbody fusion on 06/11/2022, requiring a posterior fusion and decompression with instrumentation 2.  Bilateral lower extremity pain and weakness 3.  S/p previous lumbar fusion, extending up to L3 4.  Severe L2/3 spinal stenosis 5. 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 Spondylolisthesis, L2/3 (M43.16)     PROCEDURE (Stage 2): 1. Posterior spinal fusion, L2/3 2. Posterior lumbar decompression L2/3  3. Placement of posterior segmental instrumentation extended up to L2 bilaterally 4. Use of morselized allograft - Vivigen 5. Exploration of spinal fusion, L3-4 6. Intraoperative use of floroscopy   SURGEON:  Estill Bamberg, MD   ASSISTANT:  Jason Coop, PA-C   ANESTHESIA:  General endotracheal anesthesia.   COMPLICATIONS:  None.   DISPOSITION:  Stable.   ESTIMATED BLOOD LOSS:  Minimal   INDICATIONS FOR SURGERY: Briefly, Ms. Willaims is one day status post an anterior lumbar fusion as noted above.  Please refer to my operative report dated 06/11/2022, for a full account of the patient's preoperative history and indications for surgery.  The patient did present today for stage 2 of what was to be a 2-staged procedure.   OPERATIVE DETAILS:  On 06/12/2022, the patient was brought to surgery and general endotracheal  anesthesia was administered.  The patient was placed prone onto a Jackson spinal bed.  The back was then prepped and draped in the usual sterile fashion.  A midline incision was then made, spanning approximately L2-L4.  The fascia was incised at the midline, and the paraspinal musculature was bluntly swept laterally on the right and left sides.  Self-retaining retractors were placed.  The previously-placed hardware was noted.  Bilaterally, the caps overlying the L3 pedicle screws were removed.  Following this, the cross connectors overlying the L4-5 intervertebral space were also removed, as was the upper rods bilaterally.  At this point, the L3-4 facet joints were subperiosteally exposed.  I then used Kochers to firmly grabbed hold of the right L3 pedicle screw, and the inferior construct.  I then performed a pushing and pulling maneuver, evaluating the bilateral L3-4 facet joints, and there was absolutely no motion encountered.  This did confirm a successful fusion across L3-4.  At this point, the bilateral facet joint and posterolateral gutters overlying the L2-3 intervertebral space were subperiosteally exposed.  I then used a high-speed bur to decorticate the facet joints and transverse processes bilaterally of L2 and L3.  I then proceeded with the decompressive aspect of the procedure.  Using a high-speed bur in addition to a series of Kerrison punches, I did perform a partial facetectomy bilaterally, decompressing the right and left lateral recess thoroughly.  I was very pleased with the decompression.   Then, using AP and lateral fluoroscopy, I did identify  landmarks for the starting points of the bilateral L2 pedicle screws.  I then used a gearshift probe, followed by taps, and bilaterally, 6 x 40 mm screws were advanced into the L2 pedicles uneventfully.  I was very pleased with the purchase of the screws.  At this point, bilaterally, 55 mm rods were secured into the tulip heads of the screws across  the L2-3 segment.  Caps were placed and a final locking procedure was performed.  At this point, allograft in the form of Vivigen was packed into the posterolateral gutters and into the region of the facet joints bilaterally.  This was to help aid in the success of the fusion.  The wound was copiously irrigated with normal saline, prior to placement of the allograft.  I was very pleased with the final AP and lateral fluoroscopic images.  The wound was then copiously irrigated.  The fascia was then closed in layers using #1 Vicryl, followed by 0 Vicryl, followed by 2-0 Vicryl, followed by 4-0 Monocryl.  All instrument counts were correct at the termination of the procedure.    Of note, [name] PA-C was my assistant throughout surgery, and did aid in retraction, suctioning, placement of the hardware, the decompression, and closure.      Phylliss Bob, MD

## 2022-06-12 NOTE — Anesthesia Postprocedure Evaluation (Signed)
Anesthesia Post Note  Patient: Julie Hess  Procedure(s) Performed: LUMBAR TWO THROUGH LUMBAR THREE DECOMPRESSION AND POSTERIOR SPINAL FUSION WITH INSTRUMENTATION AND ALLOGRAFT (Spine Lumbar)     Patient location during evaluation: PACU Anesthesia Type: General Level of consciousness: awake and alert Pain management: pain level controlled Vital Signs Assessment: post-procedure vital signs reviewed and stable Respiratory status: spontaneous breathing, nonlabored ventilation, respiratory function stable and patient connected to nasal cannula oxygen Cardiovascular status: blood pressure returned to baseline and stable Postop Assessment: no apparent nausea or vomiting Anesthetic complications: no   No notable events documented.  Last Vitals:  Vitals:   06/12/22 1050 06/12/22 1112  BP: (!) 117/58 114/71  Pulse: (!) 102 96  Resp: 16 18  Temp: 36.4 C   SpO2: 100% 99%    Last Pain:  Vitals:   06/12/22 1050  TempSrc:   PainSc: 4    Pain Goal: Patients Stated Pain Goal: 3 (06/12/22 0341)                 Barnet Glasgow

## 2022-06-13 ENCOUNTER — Encounter (HOSPITAL_COMMUNITY): Payer: Self-pay | Admitting: Orthopedic Surgery

## 2022-06-13 MED ORDER — FLUTICASONE FUROATE-VILANTEROL 100-25 MCG/ACT IN AEPB
1.0000 | INHALATION_SPRAY | Freq: Every day | RESPIRATORY_TRACT | Status: DC
  Administered 2022-06-13: 1 via RESPIRATORY_TRACT

## 2022-06-13 NOTE — Evaluation (Signed)
Physical Therapy Evaluation Patient Details Name: Malky Rudzinski MRN: [id number] DOB: [birth year] Today's Date: 06/13/2022  History of Present Illness  51 y/o female s/p L2-3 decompression and posterior spinal fusion with instrumentation and allograft on 06/12/22 and R sided L2-3 lateral interbody fusion with instrumentation and allograft 11/01. PMH: multiple lumbar spinal surgeries, ACDF, GERD, HLD, migraines, sleep apnea, TIA, thyroid disease, and ulcerative colitis.  Clinical Impression  Patient admitted following the above procedures. Patient typically independent and will have husband at home to assist at discharge. Patient functioning at modI level for mobility with no AD. Does require supervision for safe stair negotiation with B handrails. Re-educated on back precautions, progressive walking program, and car transfer, patient verbalized understanding. No further skilled PT needs identified acutely. No PT follow up recommended at this time.        Recommendations for follow up therapy are one component of a multi-disciplinary discharge planning process, led by the attending physician.  Recommendations may be updated based on patient status, additional functional criteria and insurance authorization.  Follow Up Recommendations No PT follow up      Assistance Recommended at Discharge PRN  Patient can return home with the following       Equipment Recommendations None recommended by PT  Recommendations for Other Services       Functional Status Assessment Patient has had a recent decline in their functional status and demonstrates the ability to make significant improvements in function in a reasonable and predictable amount of time.     Precautions / Restrictions Precautions Precautions: Back Precaution Booklet Issued: Yes (comment) Precaution Comments: Pt aware of precautions and implementing during ADL Required Braces or Orthoses: Spinal Brace Spinal Brace: Thoracolumbosacral  orthotic;Applied in sitting position (worn at all times except night time trips to restroom) Restrictions Weight Bearing Restrictions: No      Mobility  Bed Mobility Overal bed mobility: Modified Independent             General bed mobility comments: increased time but no assistance required    Transfers Overall transfer level: Modified independent Equipment used: None                    Ambulation/Gait Ambulation/Gait assistance: Modified independent (Device/Increase time) Gait Distance (Feet): 150 Feet Assistive device: None Gait Pattern/deviations: Step-through pattern Gait velocity: decreased        Stairs Stairs: Yes Stairs assistance: Supervision Stair Management: Two rails, Step to pattern, Forwards Number of Stairs: 10    Wheelchair Mobility    Modified Rankin (Stroke Patients Only)       Balance Overall balance assessment: Mild deficits observed, not formally tested                                           Pertinent Vitals/Pain Pain Assessment Pain Assessment: Faces Faces Pain Scale: Hurts little more Pain Location: operative site; R leg Pain Descriptors / Indicators: Aching, Operative site guarding Pain Intervention(s): Monitored during session    Home Living Family/patient expects to be discharged to:: Private residence Living Arrangements: Spouse/significant other Available Help at Discharge: Family Type of Home: House Home Access: Stairs to enter   Technical brewer of Steps: 2 Alternate Level Stairs-Number of Steps: 10 Home Layout: Two level;Other (Comment) (Able to live on main level if she has to, but her bedroom is in the basement) Home Equipment: Shower seat;Toilet riser  Prior Function Prior Level of Function : Independent/Modified Independent;Working/employed;Driving             Mobility Comments: was not using AD, typically uses stairs at work, but these have become progressively  more difficult and has began to use elevator. 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     Hand Dominance        Extremity/Trunk Assessment   Upper Extremity Assessment Upper Extremity Assessment: Defer to OT evaluation    Lower Extremity Assessment Lower Extremity Assessment: Generalized weakness    Cervical / Trunk Assessment Cervical / Trunk Assessment: Back Surgery  Communication   Communication: No difficulties  Cognition Arousal/Alertness: Awake/alert Behavior During Therapy: WFL for tasks assessed/performed Overall Cognitive Status: Within Functional Limits for tasks assessed                                          General Comments General comments (skin integrity, edema, etc.): VSS. Husband present and willing to assist    Exercises     Assessment/Plan    PT Assessment Patient does not need any further PT services  PT Problem List         PT Treatment Interventions      PT Goals (Current goals can be found in the Care Plan section)  Acute Rehab PT Goals Patient Stated Goal: to go home PT Goal Formulation: All assessment and education complete, DC therapy    Frequency       Co-evaluation               AM-PAC PT "6 Clicks" Mobility  Outcome Measure Help needed turning from your back to your side while in a flat bed without using bedrails?: None Help needed moving from lying on your back to sitting on the side of a flat bed without using bedrails?: None Help needed moving to and from a bed to a chair (including a wheelchair)?: None Help needed standing up from a chair using your arms (e.g., wheelchair or bedside chair)?: None Help needed to walk in hospital room?: None Help needed climbing 3-5 steps with a railing? : A Little 6 Click Score: 23    End of Session Equipment Utilized During Treatment: Back brace Activity Tolerance: Patient tolerated  treatment well Patient left: in bed;with call bell/phone within reach (seated EOB) Nurse Communication: Mobility status PT Visit Diagnosis: Muscle weakness (generalized) (M62.81)    Time: 3235-5732 PT Time Calculation (min) (ACUTE ONLY): 12 min   Charges:   PT Evaluation $PT Eval Low Complexity: 1 Low          Rowyn Spilde A. Dan Humphreys PT, DPT Acute Rehabilitation Services Office [phone]   Viviann Spare 06/13/2022, 12:05 PM

## 2022-06-13 NOTE — Progress Notes (Signed)
Patient alert and oriented, mae's well, voiding adequate amount of urine, swallowing without difficulty, no c/o pain at time of discharge. Patient discharged home with family. Script and discharged instructions given to patient. Patient and family stated understanding of instructions given. Patient has an appointment with Dr. Dumonski 

## 2022-06-13 NOTE — Progress Notes (Signed)
    Patient doing well  Denies leg pain + LBP   Physical Exam: Vitals:   06/13/22 0536 06/13/22 0758  BP: (!) 101/55 110/66  Pulse: 87 95  Resp: 20 18  Temp: 97.9 F (36.6 C) (!) 97.5 F (36.4 C)  SpO2: 100% 99%    Dressing in place NVI  Pt s/p L2/3 decompression and fusion procedure, doing well   - up with PT/OT, encourage ambulation - Percocet for pain, Robaxin for muscle spasms - likely d/c home today with f/u in 2 weeks

## 2022-06-13 NOTE — Evaluation (Signed)
Occupational Therapy Evaluation Patient Details Name: Julie Hess MRN: [id number] DOB: 1970-11-29 Today's Date: 06/13/2022   History of Present Illness Pt is a 51 y.o. female s/p L2-3 decompression and posterior spinal fusion with instrumentation and allograft on 11/02 and R sided L2-3 lateral interbody fusion with instrumentation and allograft 06/11/2022. PMH significant for multiple lumbar spinal surgeries, ACDF, GERD, HLD, migraines, sleep apnea, TIA, thyroid disease, and ulcerative colitis.   Clinical Impression   PTA, pt lived with her husband who occasionally provided assistance with dressing. Upon eval, pt performing UB Adl with mod A for brace and bra application and LB ADL with min guard A. Husband providing assistance for UB dressing. Pt educated and demonstrating use of compensatory techniques for bed mobility, brace application, LB dressing, toileting, grooming, and shower transfer within precautions. All questions answered. Recommend discharge home with no OT follow up. OT to sign off.      Recommendations for follow up therapy are one component of a multi-disciplinary discharge planning process, led by the attending physician.  Recommendations may be updated based on patient status, additional functional criteria and insurance authorization.   Follow Up Recommendations  No OT follow up    Assistance Recommended at Discharge Intermittent Supervision/Assistance  Patient can return home with the following A little help with walking and/or transfers;A little help with bathing/dressing/bathroom;Assistance with cooking/housework;Assist for transportation;Help with stairs or ramp for entrance    Functional Status Assessment  Patient has had a recent decline in their functional status and demonstrates the ability to make significant improvements in function in a reasonable and predictable amount of time.  Equipment Recommendations  None recommended by OT (Pt has recommended  equipment)    Recommendations for Other Services       Precautions / Restrictions Precautions Precautions: Back Precaution Booklet Issued: Yes (comment) Precaution Comments: Pt aware of precautions and implementing during ADL Required Braces or Orthoses: Spinal Brace Spinal Brace: Thoracolumbosacral orthotic (worn at all times except night time trips to restroom) Restrictions Weight Bearing Restrictions: No      Mobility Bed Mobility Overal bed mobility: Needs Assistance Bed Mobility: Rolling, Sidelying to Sit Rolling: Supervision Sidelying to sit: Min guard       General bed mobility comments: good use of log roll technique    Transfers Overall transfer level: Needs assistance Equipment used: None Transfers: Sit to/from Stand Sit to Stand: Supervision           General transfer comment: Supervision for sit<>stand for safety      Balance Overall balance assessment: Needs assistance Sitting-balance support: No upper extremity supported, Feet supported Sitting balance-Leahy Scale: Good     Standing balance support: Single extremity supported, No upper extremity supported, During functional activity Standing balance-Leahy Scale: Fair Standing balance comment: Min guard A for standing balance                           ADL either performed or assessed with clinical judgement   ADL Overall ADL's : Needs assistance/impaired Eating/Feeding: Modified independent;Sitting   Grooming: Supervision/safety;Standing   Upper Body Bathing: Set up;Sitting   Lower Body Bathing: Min guard;Sit to/from stand   Upper Body Dressing : With caregiver independent assisting;Standing;Moderate assistance Upper Body Dressing Details (indicate cue type and reason): Husband independent assisting with bra and brace application. OT providing set-up for brace Lower Body Dressing: Min guard;Sit to/from stand Lower Body Dressing Details (indicate cue type and reason): Min guard  A with use  of AE Toilet Transfer: Min guard;Ambulation;BSC/3in1 Toilet Transfer Details (indicate cue type and reason): Min guard A for safety.   Toileting - Clothing Manipulation Details (indicate cue type and reason): educated on use of AE and compensatory techniques Tub/ Shower Transfer: Tub transfer;Min guard;Shower seat;Ambulation Tub/Shower Transfer Details (indicate cue type and reason): Hand held assist provided by husband. Functional mobility during ADLs: Min guard General ADL Comments: Pt performing very short distance mobility in room without RW     Vision Baseline Vision/History: 1 Wears glasses Ability to See in Adequate Light: 0 Adequate Patient Visual Report: No change from baseline Vision Assessment?: No apparent visual deficits     Perception     Praxis      Pertinent Vitals/Pain Pain Assessment Pain Assessment: Faces Faces Pain Scale: Hurts little more Pain Location: operative site; R leg Pain Descriptors / Indicators: Aching, Operative site guarding Pain Intervention(s): Limited activity within patient's tolerance, Monitored during session, Repositioned     Hand Dominance     Extremity/Trunk Assessment Upper Extremity Assessment Upper Extremity Assessment: Overall WFL for tasks assessed   Lower Extremity Assessment Lower Extremity Assessment: Defer to PT evaluation   Cervical / Trunk Assessment Cervical / Trunk Assessment: Back Surgery   Communication Communication Communication: No difficulties   Cognition Arousal/Alertness: Awake/alert Behavior During Therapy: WFL for tasks assessed/performed Overall Cognitive Status: Within Functional Limits for tasks assessed                                       General Comments  VSS. Husband present and willing to assist    Exercises     Shoulder Instructions      Home Living Family/patient expects to be discharged to:: Private residence Living Arrangements: Spouse/significant  other Available Help at Discharge: Family Type of Home: House Home Access: Stairs to enter [employer] of Steps: 2   Home Layout: Two level;Other (Comment) (Able to live on main level if she has to, but her bedroom is in the basement) Alternate Level Stairs-Number of Steps: 10 Alternate Level Stairs-Rails: Right Bathroom Shower/Tub: Chief Strategy Officer: Handicapped height     Home Equipment: Shower seat;Toilet riser          Prior Functioning/Environment Prior Level of Function : Independent/Modified Independent;Working/employed;Driving             Mobility Comments: was not using AD, typically uses stairs at work, but these have become progressively more difficult and has began to use elevator. 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        OT Problem List: Decreased strength;Decreased activity tolerance;Impaired balance (sitting and/or standing);Decreased knowledge of use of DME or AE;Decreased knowledge of precautions;Pain      OT Treatment/Interventions:      OT Goals(Current goals can be found in the care plan section) Acute Rehab OT Goals Patient Stated Goal: go home OT Goal Formulation: With patient/family  OT Frequency:      Co-evaluation              AM-PAC OT "6 Clicks" Daily Activity     Outcome Measure Help from another person eating meals?: None Help from another person taking care of personal grooming?: A Little Help from another person toileting, which includes using toliet, bedpan, or urinal?: A Little Help from another person bathing (including washing, rinsing,  drying)?: A Little Help from another person to put on and taking off regular upper body clothing?: A Little Help from another person to put on and taking off regular lower body clothing?: A Little 6 Click Score: 19   End of Session Equipment Utilized During Treatment: Gait  belt;Back brace Nurse Communication: Mobility status;Other (comment);Patient requests pain meds (Requests nausea medication)  Activity Tolerance: Patient tolerated treatment well Patient left: in bed;with call bell/phone within reach;with family/visitor present  OT Visit Diagnosis: Unsteadiness on feet (R26.81);Other abnormalities of gait and mobility (R26.89);Muscle weakness (generalized) (M62.81);Pain Pain - Right/Left: Right Pain - part of body: Knee (operative site as well)                Time: 9326-7124 OT Time Calculation (min): 35 min Charges:  OT General Charges $OT Visit: 1 Visit OT Evaluation $OT Eval Low Complexity: 1 Low OT Treatments $Self Care/Home Management : 8-22 mins  Shanda Howells, OTR/L [facility] Acute Rehabilitation Office: [phone]   Lula Olszewski 06/13/2022, 9:56 AM

## 2022-06-19 NOTE — Discharge Summary (Signed)
Patient ID: Quatisha Zylka MRN: [id number] DOB/AGE: 09/07/1970 51 y.o.  Admit date: 06/11/2022 Discharge date: 06/13/2022  Admission Diagnoses:  Principal Problem:   Radiculopathy, lumbar region Active Problems:   History of back surgery   Discharge Diagnoses:  Same  Past Medical History:  Diagnosis Date   Anemia    with ulcerative colitis   Anxiety    Asthma    very mild per pt   Complication of anesthesia    Hard time waking up after C-Section 1997, but tolerated anesthesia since then no issues   Dyspnea    with activity   GERD (gastroesophageal reflux disease)    Hyperlipidemia    Hypertension    Hypothyroidism    Migraines    migraines   Sleep apnea    uses cpap   Spinal headache    Thyroid disease    TIA (transient ischemic attack) 08/30/2021   TIA versus complicated migraine, hospitalized 08/31/21 for work-up at [facility]   Ulcerative colitis, chronic (HCC)     Surgeries: Procedure(s): LUMBAR TWO THROUGH LUMBAR THREE DECOMPRESSION AND POSTERIOR SPINAL FUSION WITH INSTRUMENTATION AND ALLOGRAFT on 06/12/2022   Consultants: none  Discharged Condition: Improved  Hospital Course: Gracy Ehly is an 51 y.o. female who was admitted 06/11/2022 for operative treatment of Radiculopathy, lumbar region. Patient has severe unremitting pain that affects sleep, daily activities, and work/hobbies. After pre-op clearance the patient was taken to the operating room on 06/12/2022 and underwent  Procedure(s): LUMBAR TWO THROUGH LUMBAR THREE DECOMPRESSION AND POSTERIOR SPINAL FUSION WITH INSTRUMENTATION AND ALLOGRAFT.    Patient was given perioperative antibiotics:  Anti-infectives (From admission, onward)    Start     Dose/Rate Route Frequency Ordered Stop   06/11/22 2200  valACYclovir (VALTREX) tablet 1,000 mg  Status:  Discontinued        1,000 mg Oral Daily at bedtime 06/11/22 1351 06/13/22 1708   06/11/22 2100  vancomycin (VANCOREADY) IVPB 1250  mg/250 mL        1,250 mg 166.7 mL/hr over 90 Minutes Intravenous  Once 06/11/22 1342 06/12/22 1649   06/11/22 0645  vancomycin (VANCOREADY) IVPB 1500 mg/300 mL  Status:  Discontinued        1,500 mg 150 mL/hr over 120 Minutes Intravenous On call to O.R. 06/11/22 0644 06/11/22 1215        Patient was given sequential compression devices, early ambulation to prevent DVT.  Patient benefited maximally from hospital stay and there were no complications.    Recent vital signs: BP 110/66 (BP Location: Right Arm)   Pulse 95   Temp (!) 97.5 F (36.4 C) (Oral)   Resp 18   Ht 5\' 5"  (1.651 m)   Wt 133.4 kg   SpO2 99%   BMI 48.92 kg/m   Discharge Medications:   Allergies as of 06/13/2022       Reactions   Penicillins Hives, Shortness Of Breath, Other (See Comments)   DID THE REACTION INVOLVE: Swelling of the face/tongue/throat, SOB, or low BP? #  #  #  YES  #  #  #  Sudden or severe rash/hives, skin peeling, or the inside of the mouth or nose? No Did it require medical treatment? No When did it last happen?  2016   Other Other (See Comments)   Anti-depressants   Oxycodone Nausea Only   Prefers Hydrocodone - Oxycodone causes nausea and dizziness   Shellfish Allergy Hives, Itching, Swelling        Medication  List     STOP taking these medications    predniSONE 20 MG tablet Commonly known as: DELTASONE       TAKE these medications    albuterol 108 (90 Base) MCG/ACT inhaler Commonly known as: VENTOLIN HFA Inhale 1-2 puffs into the lungs every 6 (six) hours as needed for wheezing or shortness of breath.   ascorbic acid 500 MG tablet Commonly known as: VITAMIN C Take 500 mg by mouth daily.   benzonatate 100 MG capsule Commonly known as: TESSALON Take 1 capsule (100 mg total) by mouth every 8 (eight) hours.   Breo Ellipta 100-25 MCG/ACT Aepb Generic drug: fluticasone furoate-vilanterol Inhale 1 puff into the lungs daily.   busPIRone 15 MG tablet Commonly known as:  BUSPAR Take 15 mg by mouth 2 (two) times daily.   cholecalciferol 25 MCG (1000 UNIT) tablet Commonly known as: VITAMIN D3 Take 1,000 Units by mouth daily.   cyanocobalamin 1000 MCG tablet Take 1,000 mcg by mouth daily.   diazepam 5 MG tablet Commonly known as: VALIUM Take 1 tablet (5 mg total) by mouth every 6 (six) hours as needed for muscle spasms.   diazepam 2 MG tablet Commonly known as: VALIUM Take 2 mg by mouth daily as needed for anxiety.   estradiol 0.06 MG/24HR Commonly known as: CLIMARA Place 1 patch onto the skin every 7 (seven) days.   fexofenadine 180 MG tablet Commonly known as: ALLEGRA Take 180 mg by mouth daily.   furosemide 20 MG tablet Commonly known as: LASIX Take 20 mg by mouth daily.   HYDROcodone-acetaminophen 10-325 MG tablet Commonly known as: NORCO Take 1-2 tablets by mouth every 4 (four) hours as needed for moderate pain or severe pain.   hyoscyamine 0.125 MG SL tablet Commonly known as: LEVSIN SL Take 0.125 mg by mouth every 6 (six) hours as needed for cramping.   levothyroxine 75 MCG tablet Commonly known as: SYNTHROID Take 75 mcg by mouth daily before breakfast.   losartan 50 MG tablet Commonly known as: COZAAR Take 50 mg by mouth at bedtime.   methocarbamol 500 MG tablet Commonly known as: ROBAXIN Take 1-2 tablets (500-1,000 mg total) by mouth every 6 (six) hours as needed for muscle spasms.   montelukast 10 MG tablet Commonly known as: SINGULAIR Take 10 mg by mouth at bedtime.   Nurtec 75 MG Tbdp Generic drug: Rimegepant Sulfate Take 1 tablet by mouth daily as needed for migraine.   oxyCODONE-acetaminophen 5-325 MG tablet Commonly known as: PERCOCET/ROXICET Take 1-2 tablets by mouth every 4 (four) hours as needed for severe pain.   pantoprazole 40 MG tablet Commonly known as: PROTONIX Take 40 mg by mouth 2 (two) times daily.   pravastatin 40 MG tablet Commonly known as: PRAVACHOL Take 40 mg by mouth at bedtime.    sulfaSALAzine 500 MG tablet Commonly known as: AZULFIDINE Take 500 mg by mouth 2 (two) times daily.   valACYclovir 1000 MG tablet Commonly known as: VALTREX Take 1,000 mg by mouth at bedtime.        Diagnostic Studies: DG Lumbar Spine 2-3 Views  Result Date: 06/12/2022 CLINICAL DATA:  Surgical fusion L2-3. EXAM: LUMBAR SPINE - 2-3 VIEW COMPARISON:  Intraoperative lateral lumbar spine 06/12/2022 FINDINGS: AP and lateral C-arm images lumbar spine were obtained. Accurate lumbar level assignment not possible due to limited field of view. Pre-existing pedicle screw fusion L3-4, L4-5, L5-S1. Interval placement of pedicle screw and interbody fusion at what appears to be the L2-3 level. IMPRESSION: Interval  placement of pedicle screw and interbody fusion at what appears to be the L2-3 level. Recommend confirmation with postoperative radiographs Electronically Signed   By: Marlan Palau M.D.   On: 06/12/2022 09:52   DG Lumbar Spine 1 View  Result Date: 06/12/2022 CLINICAL DATA:  Back surgery.  L2-3 fusion. EXAM: LUMBAR SPINE - 1 VIEW COMPARISON:  Lumbar MRI 03/26/2022 FINDINGS: Suboptimal image quality. The study is under penetrated and the lower lumbar spine is not adequately evaluated. Based on prior MRI there is pedicle screw fusion and interbody fusion at L3-4, L4-5 and L5-S1. Two localizing needles seen in the posterior soft tissues of the upper lumbar spine. Metal interbody spacer seen at the disc space above the highest pedicle screw. IMPRESSION: Suboptimal image.  Lumbar localization in the operating room. Electronically Signed   By: Marlan Palau M.D.   On: 06/12/2022 09:50   DG C-Arm 1-60 Min-No Report  Result Date: 06/12/2022 Fluoroscopy was utilized by the requesting physician.  No radiographic interpretation.   DG OR LOCAL ABDOMEN  Result Date: 06/11/2022 CLINICAL DATA:  Missing needle. EXAM: OR LOCAL ABDOMEN COMPARISON:  None Available. FINDINGS: Multiple metallic densities are  seen involving the lower lumbar spine consistent with lumbar surgery. No other definite radiopaque foreign body is noted. IMPRESSION: Multiple metallic densities are noted in the lower lumbar spine consistent with lumbar surgery. No other definite radiopaque foreign body is noted. These results were called by telephone at the time of interpretation on 06/11/2022 at 11:59 am to provider Campbell Stall in the OR, who verbally acknowledged these results. Electronically Signed   By: Lupita Raider M.D.   On: 06/11/2022 11:59   DG Lumbar Spine 2-3 Views  Result Date: 06/11/2022 CLINICAL DATA:  Lumbar surgery EXAM: LUMBAR SPINE - 2-3 VIEW COMPARISON:  03/26/2022 FINDINGS: Four C-arm fluoroscopic images were obtained intraoperatively and submitted for post operative interpretation. Images obtained during lumbar fusion surgery at L2-3. 116 seconds of fluoroscopy time utilized. Radiation dose: 164.87 mGy. Please see the performing provider's procedural report for further detail. IMPRESSION: Intraoperative fluoroscopy. Electronically Signed   By: Duanne Guess D.O.   On: 06/11/2022 11:38   DG C-Arm 1-60 Min-No Report  Result Date: 06/11/2022 Fluoroscopy was utilized by the requesting physician.  No radiographic interpretation.   DG C-Arm 1-60 Min-No Report  Result Date: 06/11/2022 Fluoroscopy was utilized by the requesting physician.  No radiographic interpretation.   DG C-Arm 1-60 Min-No Report  Result Date: 06/11/2022 Fluoroscopy was utilized by the requesting physician.  No radiographic interpretation.    Disposition: Discharge disposition: 01-Home or Self Care      Pt s/p L2/3 decompression and fusion procedure, doing well    - up with PT/OT, encourage ambulation - Percocet for pain, Robaxin for muscle spasms -Scripts for pain sent to pharmacy electronically  -D/C instructions sheet printed and in chart -D/C today  -F/U in office 2 weeks   Signed: Georga Bora 06/19/2022, 12:17  PM

## 2022-11-11 DIAGNOSIS — H52203 Unspecified astigmatism, bilateral: Secondary | ICD-10-CM | POA: Insufficient documentation

## 2022-11-11 DIAGNOSIS — H16143 Punctate keratitis, bilateral: Secondary | ICD-10-CM | POA: Insufficient documentation

## 2023-03-12 DIAGNOSIS — R42 Dizziness and giddiness: Secondary | ICD-10-CM | POA: Insufficient documentation

## 2023-03-29 DIAGNOSIS — R7303 Prediabetes: Secondary | ICD-10-CM | POA: Insufficient documentation

## 2023-03-29 DIAGNOSIS — E559 Vitamin D deficiency, unspecified: Secondary | ICD-10-CM | POA: Insufficient documentation

## 2023-08-10 IMAGING — CT CT ANGIO CHEST
2 of 7 series · 19 of 46 positions shown · IV contrast (APPLIED)
Comparison: None.

CLINICAL DATA: Pulmonary embolism (PE) suspected, high prob. Chest
pain

EXAM:
CT ANGIOGRAPHY CHEST WITH CONTRAST
TECHNIQUE: Multidetector CT imaging of the chest was performed using the
standard protocol during bolus administration of intravenous
contrast. Multiplanar CT image reconstructions and MIPs were
obtained to evaluate the vascular anatomy.
CONTRAST:  61mL OMNIPAQUE IOHEXOL 350 MG/ML SOLN

[Series 6: thins · axial · 0.96mm/px · z∈[+1045,+1329]mm · 16 of 320 slices shown]
[im 18/320  lung]
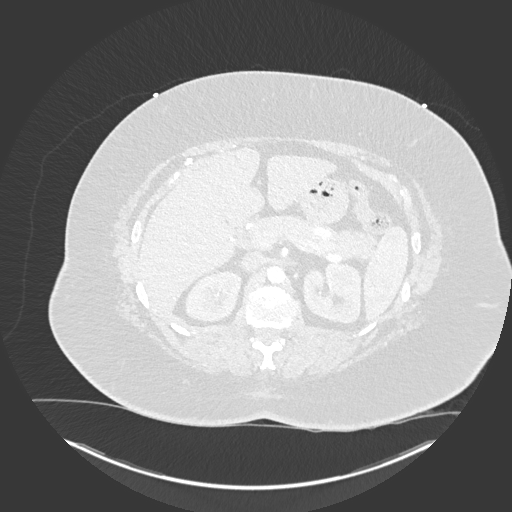
[im 36/320  soft-tissue]
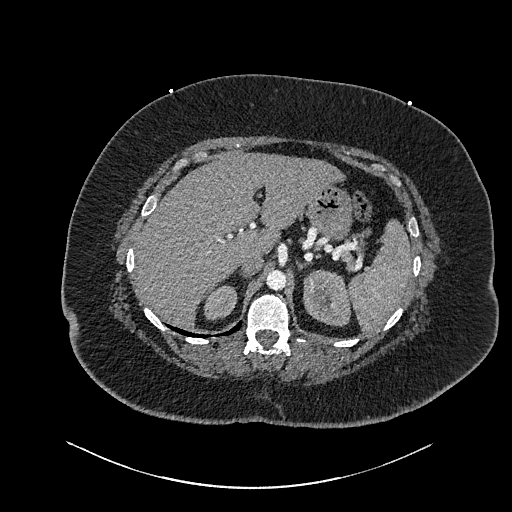
[im 54/320  lung]
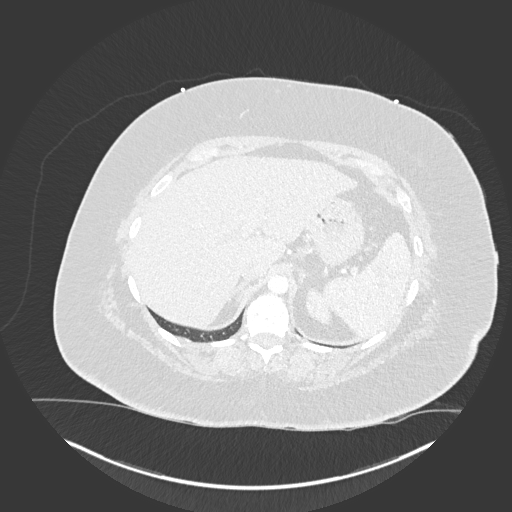
[im 71/320  soft-tissue]
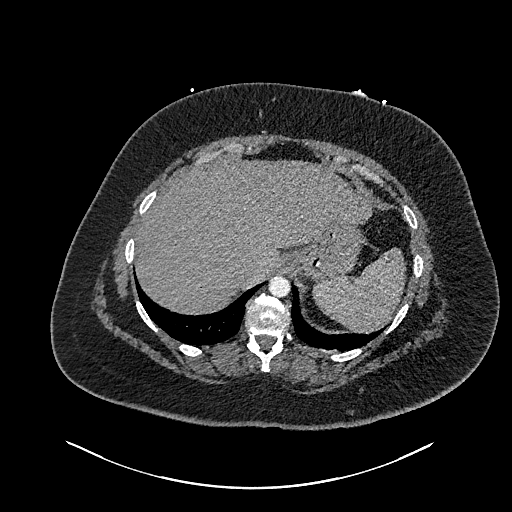
[im 89/320  lung]
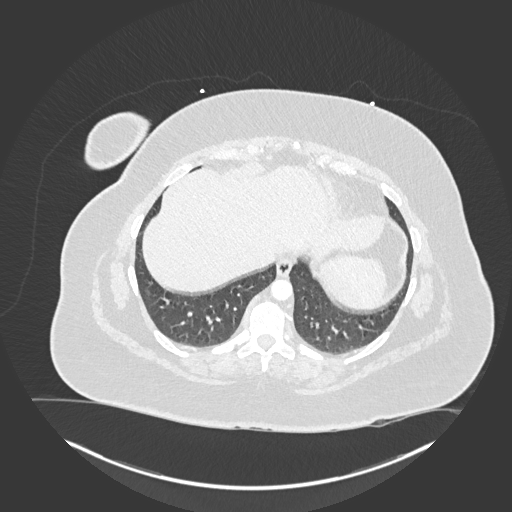
[im 107/320  soft-tissue]
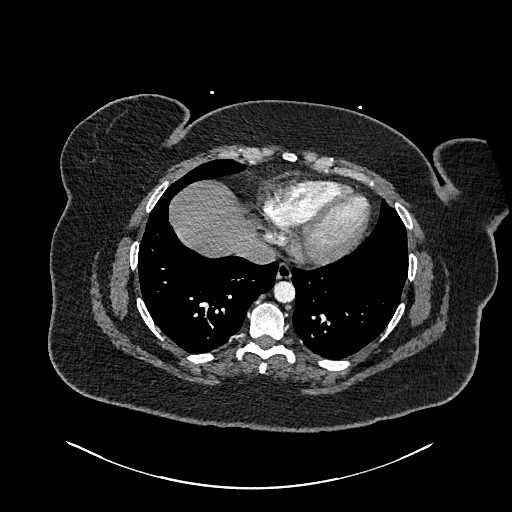
[im 125/320  lung]
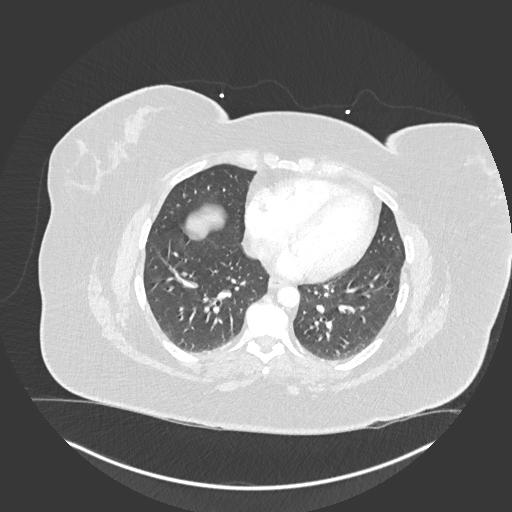
[im 142/320  soft-tissue]
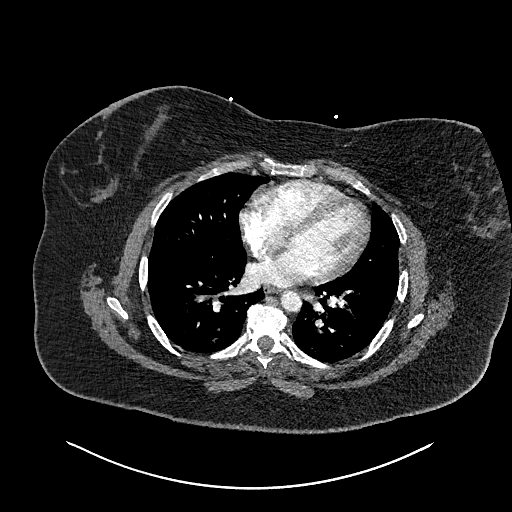
[im 178/320  lung]
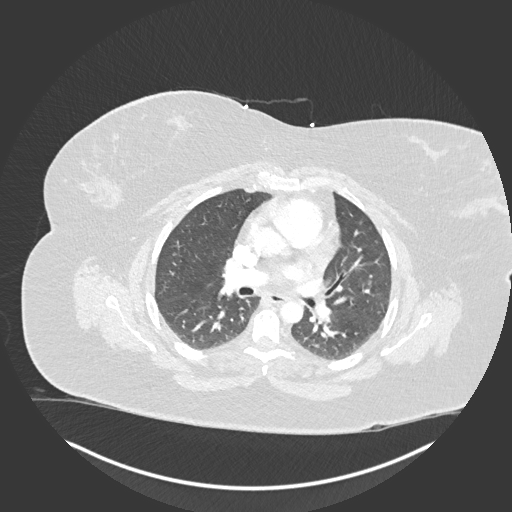
[im 195/320  soft-tissue]
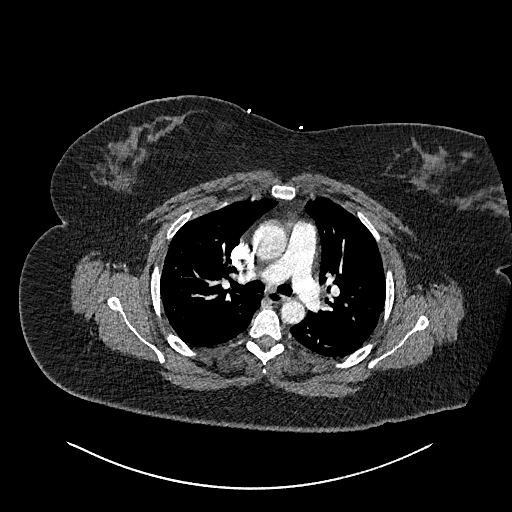
[im 213/320  lung]
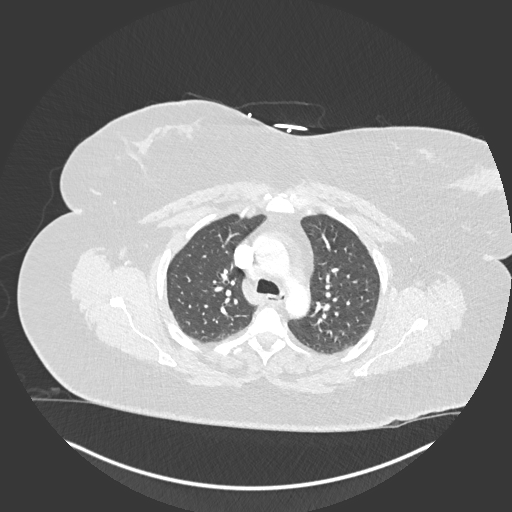
[im 231/320  soft-tissue]
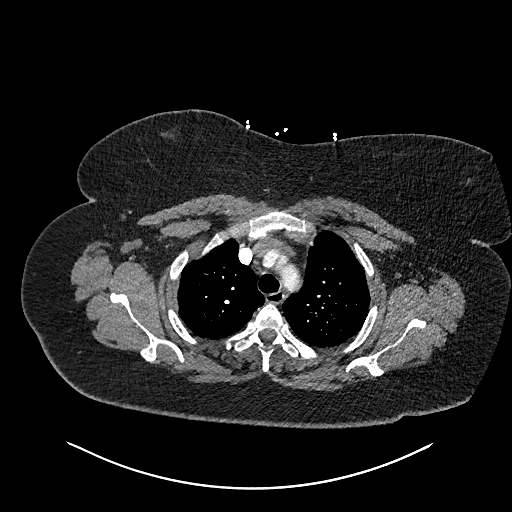
[im 249/320  lung]
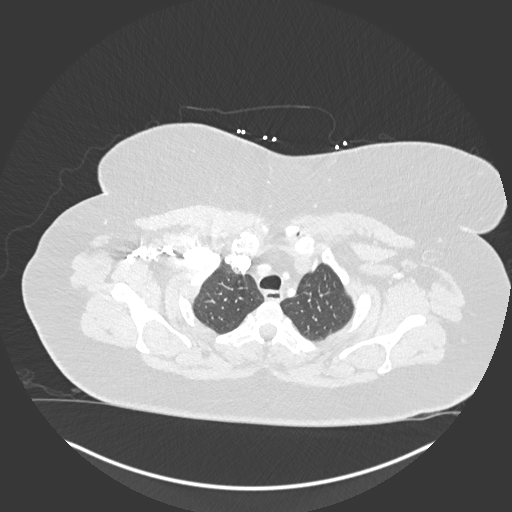
[im 266/320  soft-tissue]
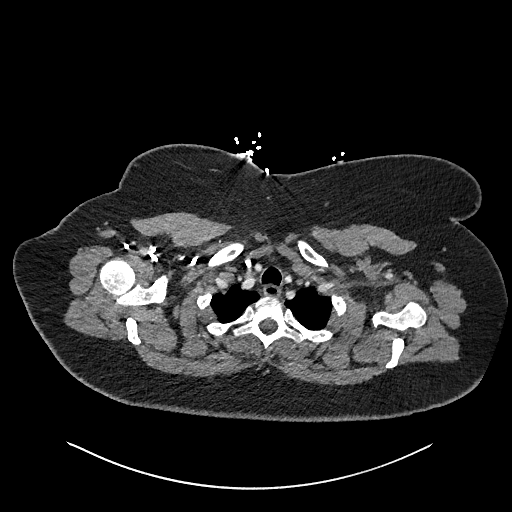
[im 284/320  lung]
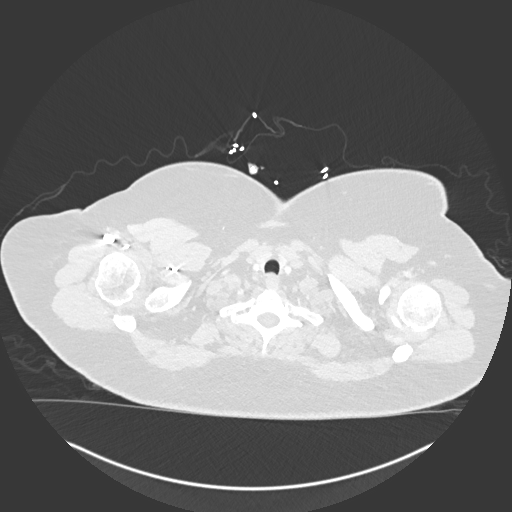
[im 302/320  soft-tissue]
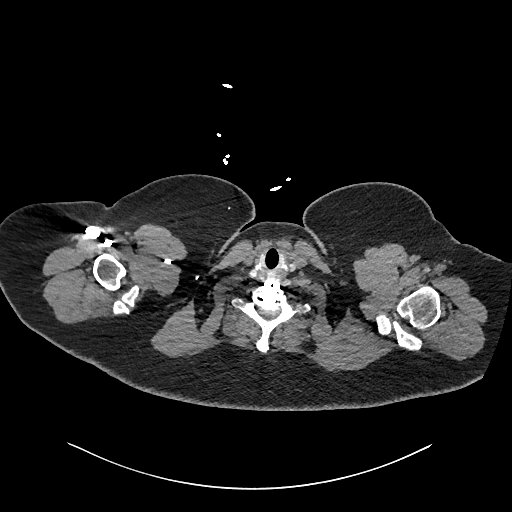

[Series 8: coronal mpr · coronal · 0.69mm/px · 3 of 158 slices shown]
[im 40/158  soft-tissue]
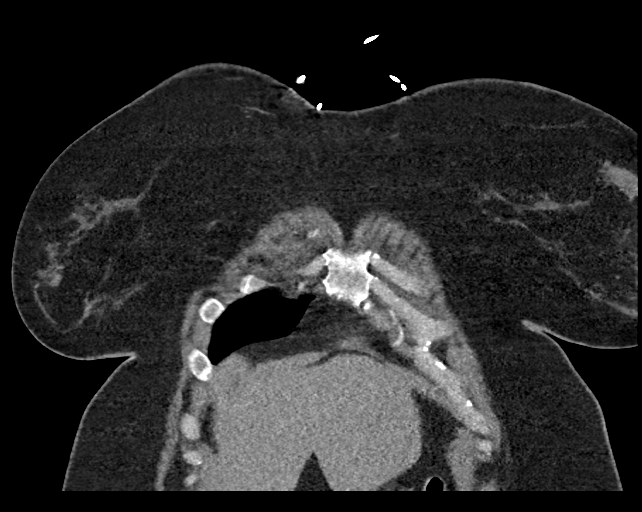
[im 79/158  soft-tissue]
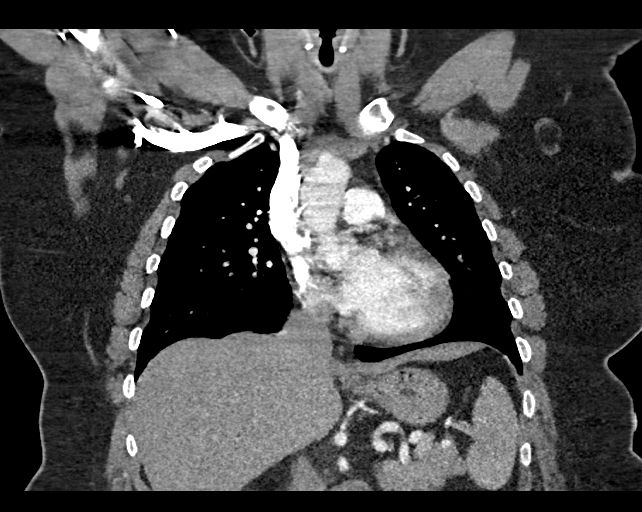
[im 118/158  soft-tissue]
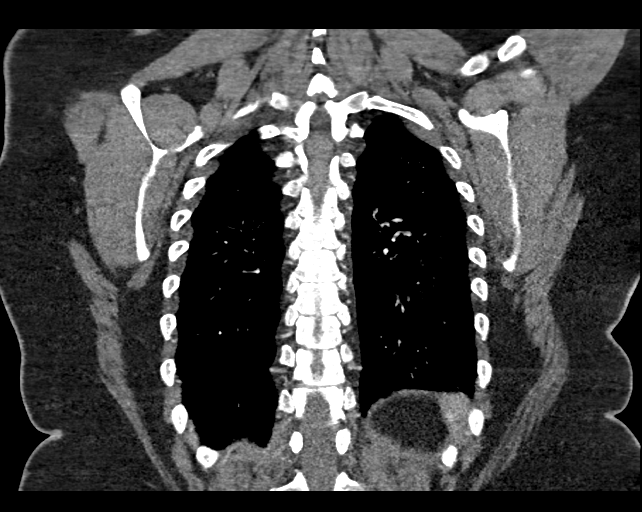

[19 of 46 positions shown; findings below may reference images not displayed]

FINDINGS: Cardiovascular: No filling defects in the pulmonary arteries to
suggest pulmonary emboli. Heart is normal size. Aorta is normal
caliber.

Mediastinum/Nodes: No mediastinal, hilar, or axillary adenopathy.
Trachea and esophagus are unremarkable. Thyroid unremarkable.

Lungs/Pleura: Lungs are clear. No focal airspace opacities or
suspicious nodules. No effusions.

Upper Abdomen: Imaging into the upper abdomen demonstrates no acute
findings.

Musculoskeletal: Chest wall soft tissues are unremarkable. No acute
bony abnormality.

Review of the MIP images confirms the above findings.
IMPRESSION: No evidence of pulmonary embolus.

No acute cardiopulmonary disease.

## 2023-12-09 ENCOUNTER — Encounter: Payer: Self-pay | Admitting: Allergy & Immunology

## 2023-12-09 ENCOUNTER — Ambulatory Visit (INDEPENDENT_AMBULATORY_CARE_PROVIDER_SITE_OTHER): Payer: Self-pay | Admitting: Allergy & Immunology

## 2023-12-09 ENCOUNTER — Other Ambulatory Visit: Payer: Self-pay

## 2023-12-09 VITALS — HR 108 | Temp 98.2°F | Resp 18 | Ht 63.0 in | Wt 300.2 lb

## 2023-12-09 DIAGNOSIS — L508 Other urticaria: Secondary | ICD-10-CM | POA: Diagnosis not present

## 2023-12-09 DIAGNOSIS — T380X5D Adverse effect of glucocorticoids and synthetic analogues, subsequent encounter: Secondary | ICD-10-CM

## 2023-12-09 DIAGNOSIS — J4530 Mild persistent asthma, uncomplicated: Secondary | ICD-10-CM | POA: Diagnosis not present

## 2023-12-09 DIAGNOSIS — Z888 Allergy status to other drugs, medicaments and biological substances status: Secondary | ICD-10-CM

## 2023-12-09 DIAGNOSIS — Z880 Allergy status to penicillin: Secondary | ICD-10-CM

## 2023-12-09 DIAGNOSIS — T7802XD Anaphylactic reaction due to shellfish (crustaceans), subsequent encounter: Secondary | ICD-10-CM

## 2023-12-09 MED ORDER — LEVOCETIRIZINE DIHYDROCHLORIDE 5 MG PO TABS: 10.0000 mg | ORAL_TABLET | Freq: Every evening | ORAL | 1 refills | Status: DC

## 2023-12-09 MED ORDER — FAMOTIDINE 20 MG PO TABS: 20.0000 mg | ORAL_TABLET | Freq: Two times a day (BID) | ORAL | 1 refills | Status: DC

## 2023-12-09 MED ORDER — BREZTRI AEROSPHERE 160-9-4.8 MCG/ACT IN AERO: 2.0000 | INHALATION_SPRAY | Freq: Two times a day (BID) | RESPIRATORY_TRACT | 5 refills | Status: DC

## 2023-12-09 NOTE — Progress Notes (Signed)
 NEW PATIENT  Date of Service/Encounter:  12/09/23  Consult requested by: Cory Dingwall, NP   Assessment:   Corticosteroids adverse reaction  Chronic urticaria   Penicillin allergy   Anaphylactic shock due to shellfish  Mild persistent asthma, uncomplicated  Plan/Recommendations:   1. Chronic urticaria - Your history does not have any "red flags" such as fevers, joint pains, or permanent skin changes that would be concerning for a more serious cause of hives.  - We will get some labs to rule out serious causes of hives: alpha gal panel, complete blood count, tryptase level, chronic urticaria panel, CMP, ESR, and CRP. - Chronic hives are often times a self limited process and will "burn themselves out" over 6-12 months, although this is not always the case.  - In the meantime, start suppressive dosing of antihistamines:   - Morning: Allegra (fexofenadine) 360mg  (two tablets) + Pepcid  (famotidine ) 20mg   - Evening: Xyzal  (levocetirizine) 10mg  (two tablets) + Pepcid  (famotidine ) 20mg  - You can change this dosing at home, decreasing the dose as needed or increasing the dosing as needed.  - If you are not tolerating the medications or are tired of taking them every day, we can start treatment with a monthly injectable medication called Xolair.  2. Penicillin allergy  - We can do a penicillin challenge to rule this out.  - without the anaphylaxis, I do not think that we need to do testing for this.   3. Anaphylactic shock due to shellfish - We will get some labs to look for a shellfish allergy . - We will call you in 1-2 weeks with the results of the testing.  4. Corticosteroids adverse reaction - We will get a testing appointment set up for testing to the steroid.   5. Chronic rhinitis - possibly some improvement with Allegra  - Because of insurance stipulations, we cannot do skin testing on the same day as your first visit. - We are all working to fight this, but for now we need  to do two separate visits.  - We will know more after we do testing at the next visit.  - The skin testing visit can be squeezed in at your convenience.  - Then we can make a more full plan to address all of your symptoms. - Be sure to stop your antihistamines for 3 days before this appointment.   6. Moderate persistent asthma, uncomplicated - Lung testing looked great today. - We are going to start you on Breztri  instead of Breo. - There is a copay card provided for this. - Spacer sample and demonstration provided. - Daily controller medication(s): Breztri  160/9/4.22mcg two puffs twice daily - Prior to physical activity: albuterol  2 puffs 10-15 minutes before physical activity. - Rescue medications: albuterol  4 puffs every 4-6 hours as needed - Asthma control goals:  * Full participation in all desired activities (may need albuterol  before activity) * Albuterol  use two time or less a week on average (not counting use with activity) * Cough interfering with sleep two time or less a month * Oral steroids no more than once a year * No hospitalizations  7. Return in about 2 weeks (around 12/23/2023) for ONE VISIT FOR SKIN TESTING (1-55) + ONE VISIT FOR STEROID TESTING + ONE VISIT FOR PCN CHALLENGE. You can have the follow up appointment with Dr. Idolina Maker or a Nurse Practicioner (our Nurse Practitioners are excellent and always have Physician oversight!).   This note in its entirety was forwarded to the Provider who requested  this consultation.  Subjective:   Julie Hess is a 53 y.o. female presenting today for evaluation of  Chief Complaint  Patient presents with   Urticaria    Hives and stomach issues - shellfish ( would like to get tested for dairy) and issues with steroid injections and penicillin causes hives     Julie Hess has a history of the following: Patient Active Problem List   Diagnosis Date Noted   Radiculopathy, lumbar region 06/11/2022   History of back  surgery 06/11/2022   Radiculopathy 08/26/2018    History obtained from: chart review and patient.  Discussed the use of AI scribe software for clinical note transcription with the patient and/or guardian, who gave verbal consent to proceed.  Julie Hess was referred by Cory Dingwall, NP.     Pulmonologist: Dr. Miki Alert is a 53 y.o. female presenting for an evaluation of environmental allergies .  Asthma/Respiratory Symptom History: She has a long-standing diagnosis of asthma, which has worsened since contracting COVID-19. She is currently managed with Breo and Singulair . Julie Hess's asthma has been well controlled. She has not required rescue medication, experienced nocturnal awakenings due to lower respiratory symptoms, nor have activities of daily living been limited. She has required no Emergency Department or Urgent Care visits for her asthma. She has required zero courses of systemic steroids for asthma exacerbations since the last visit. ACT score today is 25, indicating excellent asthma symptom control.   Allergic Rhinitis Symptom History: She has some postnasal drip and sneezing occasionally. She has never been allergy  tested in the past. She does not get sinus infections often at all.   Food Allergy  Symptom History: Her family history includes significant allergies, as her older sister and an aunt have multiple food allergies. She experiences frequent breakouts at home and has developed an aversion to certain foods, such as bacon, due to their smell.  Skin Symptom History: She experiences hives and swelling, particularly after exposure to shellfish, which has led to hospitalization in the past. The reaction involves hives and significant swelling, prompting her to avoid all seafood due to this allergy .Julie Hess is on Allegra, but reports that Allegra does not alleviate her hives. She has tried Zyrtec with similar results and finds Benadryl to be the only effective treatment for  her hives, despite its sedative effects. She has a history of spinal surgeries and has received steroid injections for pain management. The last two injections, which included dexamethasone , resulted in hives. These reactions are distinct from the typical flushing associated with steroids.  GERD Symptom History: She remains on Protonix  twice daily for her GERD. She has been largely doing well with this regimen. She has rare breakthrough symptoms.   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 She has not undergone formal allergy  testing previously.   She has a noted allergy  to penicillin, which also causes hives. She was subsequently prescribed doxycycline or a Z-Pak as alternatives.  Otherwise, there is no history of other atopic diseases, including food allergies, stinging insect allergies, or contact dermatitis. There is no significant infectious history. Vaccinations are up to date.    Past Medical History: Patient Active Problem List   Diagnosis Date Noted   Radiculopathy, lumbar region 06/11/2022   History of back surgery 06/11/2022   Radiculopathy 08/26/2018    Medication List:  Allergies as of 12/09/2023       Reactions   Dexamethasone  Cough, Other (See Comments), Hives, Itching,  Shortness Of Breath, Swelling   Penicillins Hives, Shortness Of Breath, Other (See Comments)   DID THE REACTION INVOLVE: Swelling of the face/tongue/throat, SOB, or low BP? #  #  #  YES  #  #  #  Sudden or severe rash/hives, skin peeling, or the inside of the mouth or nose? No Did it require medical treatment? No When did it last happen?  2016   Prazosin Other (See Comments)   Pt states she had Stroke (TIA) after taking medication.   Shellfish Allergy  Hives, Itching, Swelling, Anaphylaxis   Other Other (See Comments)   Anti-depressants   Sulfamethoxazole-trimethoprim Dermatitis   Oxycodone  Nausea Only   Prefers Hydrocodone  - Oxycodone   causes nausea and dizziness        Medication List        Accurate as of December 09, 2023  4:44 PM. If you have any questions, ask your nurse or doctor.          STOP taking these medications    benzonatate  100 MG capsule Commonly known as: TESSALON  Stopped by: Rochester Chuck   levothyroxine  75 MCG tablet Commonly known as: SYNTHROID  Stopped by: [place] Chuck   methocarbamol  500 MG tablet Commonly known as: ROBAXIN  Stopped by: [place] Chuck   oxyCODONE -acetaminophen  5-325 MG tablet Commonly known as: PERCOCET/ROXICET Stopped by: [place] Chuck       TAKE these medications    albuterol  108 (90 Base) MCG/ACT inhaler Commonly known as: VENTOLIN  HFA Inhale 1-2 puffs into the lungs every 6 (six) hours as needed for wheezing or shortness of breath.   ascorbic acid  500 MG tablet Commonly known as: VITAMIN C  Take 500 mg by mouth daily.   B-D 3CC LUER-LOK SYR 25GX5/8" 25G X 5/8" 3 ML Misc Generic drug: SYRINGE-NEEDLE (DISP) 3 ML INJECT AS DIRECTED 3 ML SYRINGES TO BE USED WITH BEFORE-12 INJECTIONS FOR 30 DAYS   Breo Ellipta  100-25 MCG/ACT Aepb Generic drug: fluticasone  furoate-vilanterol Inhale 1 puff into the lungs daily.   Breztri  Aerosphere 160-9-4.8 MCG/ACT Aero inhaler Generic drug: budeson-glycopyrrolate -formoterol Inhale 2 puffs into the lungs in the morning and at bedtime. Started by: [place] Chuck   busPIRone  15 MG tablet Commonly known as: BUSPAR  Take 15 mg by mouth 2 (two) times daily.   cholecalciferol  25 MCG (1000 UNIT) tablet Commonly known as: VITAMIN D3 Take 1,000 Units by mouth daily.   cyanocobalamin  1000 MCG tablet Take 1,000 mcg by mouth daily.   cyanocobalamin  1000 MCG/ML injection Commonly known as: VITAMIN B12 Inject 1,000 mcg into the muscle.   diazepam  5 MG tablet Commonly known as: VALIUM  Take 1 tablet (5 mg total) by mouth every 6 (six) hours as needed for muscle spasms.   diazepam  2 MG  tablet Commonly known as: VALIUM  Take 2 mg by mouth daily as needed for anxiety.   estradiol  0.06 MG/24HR Commonly known as: CLIMARA  Place 1 patch onto the skin every 7 (seven) days.   famotidine  20 MG tablet Commonly known as: Pepcid  Take 1 tablet (20 mg total) by mouth 2 (two) times daily. Started by: [place] Chuck   fexofenadine 180 MG tablet Commonly known as: ALLEGRA Take 180 mg by mouth daily.   furosemide  20 MG tablet Commonly known as: LASIX  Take 20 mg by mouth daily.   HYDROcodone -acetaminophen  10-325 MG tablet Commonly known as: NORCO Take 1-2 tablets by mouth every 4 (four) hours as needed for moderate pain or severe pain.   hyoscyamine  0.125 MG SL tablet Commonly  known as: LEVSIN SL Take 0.125 mg by mouth every 6 (six) hours as needed for cramping.   levocetirizine 5 MG tablet Commonly known as: XYZAL  Take 2 tablets (10 mg total) by mouth every evening. Started by: [place] Chuck   losartan  50 MG tablet Commonly known as: COZAAR  Take 50 mg by mouth at bedtime.   montelukast  10 MG tablet Commonly known as: SINGULAIR  Take 10 mg by mouth at bedtime.   Nurtec 75 MG Tbdp Generic drug: Rimegepant Sulfate  Take 1 tablet by mouth daily as needed for migraine.   pantoprazole  40 MG tablet Commonly known as: PROTONIX  Take 40 mg by mouth 2 (two) times daily.   pravastatin  40 MG tablet Commonly known as: PRAVACHOL  Take 40 mg by mouth at bedtime.   Semaglutide-Weight Management 0.25 MG/0.5ML Soaj Inject 0.25 mg into the skin once a week.   sulfaSALAzine  500 MG tablet Commonly known as: AZULFIDINE  Take 500 mg by mouth 2 (two) times daily.   valACYclovir  1000 MG tablet Commonly known as: VALTREX  Take 1,000 mg by mouth at bedtime.        Birth History: non-contributory  Developmental History: non-contributory  Past Surgical History: Past Surgical History:  Procedure Laterality Date   ABDOMINAL HYSTERECTOMY  2002   ANAL FISSURECTOMY   12/2021   ANTERIOR CERVICAL DECOMPRESSION/DISCECTOMY FUSION 4 LEVEL/HARDWARE REMOVAL N/A 08/26/2018   Procedure: ANTERIOR CERVICAL DECOMPRESSION FUSION, CERVICAL 4-5, CERVICAL 5-6, CERVICAL 6-7 WITH INSTRUMENTATION AND ALLOGRAFT;  Surgeon: Virl Grimes, MD;  Location: [facility] OR;  Service: Orthopedics;  Laterality: N/A;   ANTERIOR LAT LUMBAR FUSION Left 03/30/2019   Procedure: LEFT LATERAL LUMBAR THREE-FOUR INTERBODY FUSION WITH  ALLOGRAFT;  Surgeon: Virl Grimes, MD;  Location: [facility] OR;  Service: Orthopedics;  Laterality: Left;  LEFT LATERAL LUMBAR THREE-FOUR INTERBODY FUSION WITH  ALLOGRAFT   ANTERIOR LAT LUMBAR FUSION Right 06/11/2022   Procedure: RIGHT-SIDED LUMBAR TWO- LUMBAR THREE LATERAL INTERBODY FUSION WITH INSTRUMENTATION AND ALLOGRAFT;  Surgeon: Virl Grimes, MD;  Location: [facility] OR;  Service: Orthopedics;  Laterality: Right;   APPENDECTOMY     BACK SURGERY  2019   lumbar fusion   BACK SURGERY     discetomy   CESAREAN SECTION  1997   CHOLECYSTECTOMY     SPINE SURGERY  08/11/1998   lumbar     Family History: Family History  Problem Relation Age of Onset   Eczema Mother    Asthma Mother    Allergic rhinitis Mother    Eczema Father    Asthma Father    Allergic rhinitis Father    Eczema Sister    Asthma Sister    Allergic rhinitis Sister    Eczema Brother    Asthma Brother    Allergic rhinitis Brother      Social History: Rekeisha lives at home with her family.  She lives in a house that is 52 years old.  There is hardwood throughout the home.  She has a coupon for heating and central cooling.  They have dogs inside of the home and a cat outside of the home.  There are no dust covers on the bedding.  There is no tobacco exposure.  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 She is not exposed to fumes, chemicals, or dust. There is a HEPA filter in the home.    Review of systems otherwise negative other than that mentioned in the HPI.    Objective:    Pulse (!) 108, temperature 98.2 F (36.8  C), temperature source Temporal, resp. rate 18, height 5\' 3"  (1.6 m), weight (!) 300 lb 3.2 oz (136.2 kg), SpO2 96%. Body mass index is 53.18 kg/m.     Physical Exam Vitals reviewed.  Constitutional:      Appearance: She is well-developed.     Comments: Talkative.  HENT:     Head: Normocephalic and atraumatic.     Right Ear: Tympanic membrane, ear canal and external ear normal. No drainage, swelling or tenderness. Tympanic membrane is not injected, scarred, erythematous, retracted or bulging.     Left Ear: Tympanic membrane, ear canal and external ear normal. No drainage, swelling or tenderness. Tympanic membrane is not injected, scarred, erythematous, retracted or bulging.     Nose: No nasal deformity, septal deviation, mucosal edema or rhinorrhea.     Right Turbinates: Enlarged, swollen and pale.     Left Turbinates: Enlarged, swollen and pale.     Right Sinus: No maxillary sinus tenderness or frontal sinus tenderness.     Left Sinus: No maxillary sinus tenderness or frontal sinus tenderness.     Mouth/Throat:     Mouth: Mucous membranes are not pale and not dry.     Pharynx: Uvula midline.  Eyes:     General: Lids are normal. Allergic shiner present.        Right eye: No discharge.        Left eye: No discharge.     Conjunctiva/sclera: Conjunctivae normal.     Right eye: Right conjunctiva is not injected. No chemosis.    Left eye: Left conjunctiva is not injected. No chemosis.    Pupils: Pupils are equal, round, and reactive to light.  Cardiovascular:     Rate and Rhythm: Normal rate and regular rhythm.     Heart sounds: Normal heart sounds.  Pulmonary:     Effort: Pulmonary effort is normal. No tachypnea, accessory muscle usage or respiratory distress.     Breath sounds: Normal breath sounds. No wheezing, rhonchi or rales.  Chest:     Chest wall: No tenderness.  Abdominal:     Tenderness: There is no abdominal tenderness.  There is no guarding or rebound.  Lymphadenopathy:     Head:     Right side of head: No submandibular, tonsillar or occipital adenopathy.     Left side of head: No submandibular, tonsillar or occipital adenopathy.     Cervical: No cervical adenopathy.  Skin:    Coloration: Skin is not pale.     Findings: No abrasion, erythema, petechiae or rash. Rash is not papular, urticarial or vesicular.  Neurological:     Mental Status: She is alert.  Psychiatric:        Behavior: Behavior is cooperative.     Diagnostic studies:    Spirometry: results normal (FEV1: 2.58/98%, FVC: 3.00/91%, FEV1/FVC: 86%).    Spirometry consistent with normal pattern. Xopenex four puffs via MDI treatment given in clinic with no improvement.  Allergy  Studies: deferred due to insurance stipulations that require a separate visit for testing       Drexel Gentles, MD [facility] 

## 2023-12-09 NOTE — Patient Instructions (Addendum)
 1. Chronic urticaria - Your history does not have any "red flags" such as fevers, joint pains, or permanent skin changes that would be concerning for a more serious cause of hives.  - We will get some labs to rule out serious causes of hives: alpha gal panel, complete blood count, tryptase level, chronic urticaria panel, CMP, ESR, and CRP. - Chronic hives are often times a self limited process and will "burn themselves out" over 6-12 months, although this is not always the case.  - In the meantime, start suppressive dosing of antihistamines:   - Morning: Allegra (fexofenadine) 360mg  (two tablets) + Pepcid (famotidine) 20mg   - Evening: Xyzal (levocetirizine) 10mg  (two tablets) + Pepcid (famotidine) 20mg  - You can change this dosing at home, decreasing the dose as needed or increasing the dosing as needed.  - If you are not tolerating the medications or are tired of taking them every day, we can start treatment with a monthly injectable medication called Xolair.  2. Penicillin allergy - We can do a penicillin challenge to rule this out.  - without the anaphylaxis, I do not think that we need to do testing for this.   3. Anaphylactic shock due to shellfish - We will get some labs to look for a shellfish allergy. - We will call you in 1-2 weeks with the results of the testing.  4. Corticosteroids adverse reaction - We will get a testing appointment set up for testing to the steroid.   5. Chronic rhinitis - possibly some improvement with Allegra  - Because of insurance stipulations, we cannot do skin testing on the same day as your first visit. - We are all working to fight this, but for now we need to do two separate visits.  - We will know more after we do testing at the next visit.  - The skin testing visit can be squeezed in at your convenience.  - Then we can make a more full plan to address all of your symptoms. - Be sure to stop your antihistamines for 3 days before this appointment.    6. Moderate persistent asthma, uncomplicated - Lung testing looked great today. - We are going to start you on Breztri instead of Breo. - There is a copay card provided for this. - Spacer sample and demonstration provided. - Daily controller medication(s): Breztri 160/9/4.73mcg two puffs twice daily - Prior to physical activity: albuterol  2 puffs 10-15 minutes before physical activity. - Rescue medications: albuterol  4 puffs every 4-6 hours as needed - Asthma control goals:  * Full participation in all desired activities (may need albuterol  before activity) * Albuterol  use two time or less a week on average (not counting use with activity) * Cough interfering with sleep two time or less a month * Oral steroids no more than once a year * No hospitalizations  7. Return in about 2 weeks (around 12/23/2023) for ONE VISIT FOR SKIN TESTING (1-55) + ONE VISIT FOR STEROID TESTING + ONE VISIT FOR PCN CHALLENGE. You can have the follow up appointment with Dr. Idolina Maker or a Nurse Practicioner (our Nurse Practitioners are excellent and always have Physician oversight!).    Please inform us  of any Emergency Department visits, hospitalizations, or changes in symptoms. Call us  before going to the ED for breathing or allergy symptoms since we might be able to fit you in for a sick visit. Feel free to contact us  anytime with any questions, problems, or concerns.  It was a pleasure to meet  you today!  Websites that have reliable patient information: 1. American Academy of Asthma, Allergy, and Immunology: www.aaaai.org 2. Food Allergy Research and Education (FARE): foodallergy.org 3. Mothers of Asthmatics: http://www.asthmacommunitynetwork.org 4. American College of Allergy, Asthma, and Immunology: www.acaai.org      "Like" us  on Facebook and Instagram for our latest updates!      A healthy democracy works best when [employer] participate! Make sure you are registered to vote! If you have moved  or changed any of your contact information, you will need to get this updated before voting! Scan the QR codes below to learn more!

## 2023-12-11 ENCOUNTER — Encounter: Payer: Self-pay | Admitting: Allergy & Immunology

## 2023-12-16 ENCOUNTER — Encounter: Payer: Self-pay | Admitting: Allergy & Immunology

## 2023-12-16 DIAGNOSIS — L508 Other urticaria: Secondary | ICD-10-CM

## 2023-12-16 DIAGNOSIS — R768 Other specified abnormal immunological findings in serum: Secondary | ICD-10-CM

## 2023-12-18 LAB — ALPHA-GAL PANEL
Allergen Lamb IgE: <0.1 kU/L
Beef IgE: <0.1 kU/L
IgE (Immunoglobulin E), Serum: <2 [IU]/mL — ABNORMAL LOW (ref 6–495)
O215-IgE Alpha-Gal: <0.1 kU/L
Pork IgE: <0.1 kU/L

## 2023-12-18 LAB — CMP14+EGFR
ALT: 25 IU/L (ref 0–32)
AST: 23 IU/L (ref 0–40)
Albumin: 4.4 g/dL (ref 3.8–4.9)
Alkaline Phosphatase: 138 IU/L — ABNORMAL HIGH (ref 44–121)
BUN/Creatinine Ratio: 10 (ref 9–23)
BUN: 8 mg/dL (ref 6–24)
Bilirubin Total: 0.3 mg/dL (ref 0.0–1.2)
CO2: 26 mmol/L (ref 20–29)
Calcium: 9.6 mg/dL (ref 8.7–10.2)
Chloride: 100 mmol/L (ref 96–106)
Creatinine, Ser: 0.8 mg/dL (ref 0.57–1.00)
Globulin, Total: 2.6 g/dL (ref 1.5–4.5)
Glucose: 79 mg/dL (ref 70–99)
Potassium: 4.2 mmol/L (ref 3.5–5.2)
Sodium: 140 mmol/L (ref 134–144)
Total Protein: 7 g/dL (ref 6.0–8.5)
eGFR: 89 mL/min/{1.73_m2} (ref >59)

## 2023-12-18 LAB — ALLERGY PANEL 19, SEAFOOD GROUP
Allergen Salmon IgE: <0.1 kU/L
Catfish: <0.1 kU/L
Codfish IgE: <0.1 kU/L
F023-IgE Crab: <0.1 kU/L
F080-IgE Lobster: <0.1 kU/L
Shrimp IgE: <0.1 kU/L
Tuna: <0.1 kU/L

## 2023-12-18 LAB — THYROID ANTIBODIES (THYROPEROXIDASE & THYROGLOBULIN)
Thyroglobulin Antibody: <1 [IU]/mL (ref 0.0–0.9)
Thyroperoxidase Ab SerPl-aCnc: <9 [IU]/mL (ref 0–34)

## 2023-12-18 LAB — FANA STAINING PATTERNS: Speckled Pattern: 1:320 {titer} — ABNORMAL HIGH

## 2023-12-18 LAB — C-REACTIVE PROTEIN: CRP: 11 mg/L — ABNORMAL HIGH (ref 0–10)

## 2023-12-18 LAB — SEDIMENTATION RATE: Sed Rate: 19 mm/h (ref 0–40)

## 2023-12-18 LAB — ANTINUCLEAR ANTIBODIES, IFA: ANA Titer 1: POSITIVE — AB

## 2023-12-18 LAB — TRYPTASE: Tryptase: 4.5 ug/L (ref 2.2–13.2)

## 2023-12-18 LAB — CHRONIC URTICARIA: cu index: 3 (ref <10)

## 2023-12-31 ENCOUNTER — Ambulatory Visit (INDEPENDENT_AMBULATORY_CARE_PROVIDER_SITE_OTHER): Admitting: Allergy & Immunology

## 2023-12-31 ENCOUNTER — Telehealth: Payer: Self-pay | Admitting: Allergy & Immunology

## 2023-12-31 DIAGNOSIS — J310 Chronic rhinitis: Secondary | ICD-10-CM

## 2023-12-31 DIAGNOSIS — J3089 Other allergic rhinitis: Secondary | ICD-10-CM | POA: Diagnosis not present

## 2023-12-31 MED ORDER — PENICILLIN V POTASSIUM 125 MG/5ML PO SOLR: ORAL | 0 refills | Status: DC

## 2023-12-31 NOTE — Telephone Encounter (Signed)
 PT needs help to review scheduled food challenge for 02/04/24 - please call pt with directions and clarify type of food

## 2023-12-31 NOTE — Patient Instructions (Addendum)
 1. Chronic urticaria - You did have the elected ANA which is consistent with lupus and a whole host of other autoimmune diseases. - I think Dr. Rodell Citrin will be able to clarify that more fully.  - Chronic hives are often times a self limited process and will "burn themselves out" over 6-12 months, although this is not always the case.  - Restart suppressive dosing of antihistamines:   - Morning: Allegra (fexofenadine) 360mg  (two tablets) + Pepcid  (famotidine ) 20mg   - Evening: Xyzal  (levocetirizine) 10mg  (two tablets) + Pepcid  (famotidine ) 20mg  - You can change this dosing at home, d are tired of taking them every day, we can start treatment with a monthly injectable medication called Xolair.ecreasing the dose as needed or increasing the dosing as needed.   2. Penicillin allergy  - We will see you for the penicillin challenge.  - I sent in the prescription for the penicillin, so bring that into the clinic.   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  - Food sheet provided today.   4. Corticosteroids adverse reaction - We will get this steroid testing scheduled after the penicillin challenge.   5. Chronic rhinitis - possibly some improvement with Allegra  - Testing today showed: grasses and trees - Copy of test results provided.  - Avoidance measures provided. - Continue with: all of the antihistamines for your hives/itching  6. Moderate persistent asthma, uncomplicated - Lung testing looked great at the last visit.  - Daily controller medication(s): Breztri  160/9/4.54mcg two puffs twice daily - Prior to physical activity: albuterol  2 puffs 10-15 minutes before physical activity. - Rescue medications: albuterol  4 puffs every 4-6 hours as needed - Asthma control goals:  * Full participation in all desired activities (may need albuterol  before activity) * Albuterol  use two time or less a week on average (not counting use with  activity) * Cough interfering with sleep two time or less a month * Oral steroids no more than once a year * No hospitalizations  7. No follow-ups on file. You can have the follow up appointment with Dr. Idolina Maker or a Nurse Practicioner (our Nurse Practitioners are excellent and always have Physician oversight!).    Please inform us  of any Emergency Department visits, hospitalizations, or changes in symptoms. Call us  before going to the ED for breathing or allergy  symptoms since we might be able to fit you in for a sick visit. Feel free to contact us  anytime with any questions, problems, or concerns.  It was a pleasure to meet you today!  Websites that have reliable patient information: 1. American Academy of Asthma, Allergy , and Immunology: www.aaaai.org 2. Food Allergy  Research and Education (FARE): foodallergy.org 3. Mothers of Asthmatics: http://www.asthmacommunitynetwork.org 4. American College of Allergy , Asthma, and Immunology: www.acaai.org      "Like" us  on Facebook and Instagram for our latest updates!      A healthy democracy works best when [employer] participate! Make sure you are registered to vote! If you have moved or changed any of your contact information, you will need to get this updated before voting! Scan the QR codes below to learn more!       Airborne Adult Perc - 12/31/23 1556     Time Antigen Placed 1556    Allergen Manufacturer Floyd Hutchinson    Location Back    Number of Test 55    1. Control-Buffer 50% Glycerol Negative    2. Control-Histamine 2+    3.  Bahia Negative    4. French Southern Territories Negative    5. Johnson Negative    6. [place]  Blue 2+    7. Meadow Fescue Negative    8. Perennial Rye Negative    9. Timothy Negative    10. Ragweed Mix Negative    11. Cocklebur Negative    12. Plantain,  English Negative    13. Baccharis Negative    14. Dog Fennel Negative    15. Russian Thistle Negative    16. Lamb's Quarters Negative    17. Sheep Sorrell  Negative    18. Rough Pigweed Negative    19. Marsh Elder, Rough Negative    20. Mugwort, Common Negative    21. Box, Elder Negative    22. Cedar, red 2+    23. Sweet Gum Negative    24. Pecan Pollen Negative    25. Pine Mix Negative    26. Walnut, Black Pollen Negative    27. Red Mulberry Negative    28. Ash Mix Negative    29. Birch Mix Negative    30. Beech American Negative    31. Cottonwood, [place] 2+    32. Hickory, White Negative    33. Maple Mix Negative    34. Oak, [place] Mix 2+    35. Sycamore Eastern 2+    36. Alternaria Alternata Negative    37. Cladosporium Herbarum Negative    38. Aspergillus Mix Negative    39. Penicillium Mix Negative    40. Bipolaris Sorokiniana (Helminthosporium) Negative    41. Drechslera Spicifera (Curvularia) Negative    42. Mucor Plumbeus Negative    43. Fusarium Moniliforme Negative    44. Aureobasidium Pullulans (pullulara) Negative    45. Rhizopus Oryzae Negative    46. Botrytis Cinera Negative    47. Epicoccum Nigrum Negative    48. Phoma Betae Negative    49. Dust Mite Mix Negative    50. Cat Hair 10,000 BAU/ml Negative    51.  Dog Epithelia Negative    52. Mixed Feathers Negative    53. Horse Epithelia Negative    54. Cockroach, German Negative    55. Tobacco Leaf Negative             Intradermal - 12/31/23 1635     Time Antigen Placed 1645    Allergen Manufacturer Floyd Hutchinson    Location Arm    Number of Test 13    Control Negative    Bahia Negative    French Southern Territories Negative    Johnson Negative    Ragweed Mix Negative    Weed Mix Negative    Mold 1 Negative    Mold 2 Negative    Mold 3 Negative    Mold 4 Negative    Mite Mix Negative    Cat Negative    Dog Negative    Cockroach Negative             Reducing Pollen Exposure  The American Academy of Allergy , Asthma and Immunology suggests the following steps to reduce your exposure to pollen during allergy  seasons.    Do not hang sheets or clothing out to  dry; pollen may collect on these items. Do not mow lawns or spend time around freshly cut grass; mowing stirs up pollen. Keep windows closed at night.  Keep car windows closed while driving. Minimize morning activities outdoors, a time when pollen counts are usually at their highest. Stay indoors as much as possible when pollen counts or humidity  is high and on windy days when pollen tends to remain in the air longer. Use air conditioning when possible.  Many air conditioners have filters that trap the pollen spores. Use a HEPA room air filter to remove pollen form the indoor air you breathe.

## 2023-12-31 NOTE — Progress Notes (Signed)
 FOLLOW UP  Date of Service/Encounter:  12/31/23   Assessment:   Corticosteroids adverse reaction   Chronic urticaria    Penicillin allergy    Anaphylactic shock due to shellfish   Mild persistent asthma, uncomplicated  Plan/Recommendations:   1. Chronic urticaria - You did have the elected ANA which is consistent with lupus and a whole host of other autoimmune diseases. - I think Dr. Rodell Citrin will be able to clarify that more fully.  - Chronic hives are often times a self limited process and will "burn themselves out" over 6-12 months, although this is not always the case.  - Restart suppressive dosing of antihistamines:   - Morning: Allegra (fexofenadine) 360mg  (two tablets) + Pepcid  (famotidine ) 20mg   - Evening: Xyzal  (levocetirizine) 10mg  (two tablets) + Pepcid  (famotidine ) 20mg  - You can change this dosing at home, d are tired of taking them every day, we can start treatment with a monthly injectable medication called Xolair.ecreasing the dose as needed or increasing the dosing as needed.   2. Penicillin allergy  - We will see you for the penicillin challenge.  - I sent in the prescription for the penicillin, so bring that into the clinic.   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  - Food sheet provided today.   4. Corticosteroids adverse reaction - We will get this steroid testing scheduled after the penicillin challenge.   5. Chronic rhinitis - possibly some improvement with Allegra  - Testing today showed: grasses and trees - Copy of test results provided.  - Avoidance measures provided. - Continue with: all of the antihistamines for your hives/itching  6. Moderate persistent asthma, uncomplicated - Lung testing looked great at the last visit.  - Daily controller medication(s): Breztri  160/9/4.20mcg two puffs twice daily - Prior to physical activity: albuterol  2 puffs 10-15 minutes before physical  activity. - Rescue medications: albuterol  4 puffs every 4-6 hours as needed - Asthma control goals:  * Full participation in all desired activities (may need albuterol  before activity) * Albuterol  use two time or less a week on average (not counting use with activity) * Cough interfering with sleep two time or less a month * Oral steroids no more than once a year * No hospitalizations  7. Follow up as scheduled.     Subjective:   Julie Hess is a 53 y.o. female presenting today for follow up of  Chief Complaint  Patient presents with   Allergy  Testing    Adult Airborne 1-55    Julie Hess has a history of the following: Patient Active Problem List   Diagnosis Date Noted   Radiculopathy, lumbar region 06/11/2022   History of back surgery 06/11/2022   Radiculopathy 08/26/2018    History obtained from: chart review and patient.  Discussed the use of AI scribe software for clinical note transcription with the patient and/or guardian, who gave verbal consent to proceed.  Julie Hess is a 53 y.o. female presenting for skin testing. She was last seen on April 30th. We could not do testing because her insurance company does not cover testing on the same day as a New Patient visit. She has been off of all antihistamines 3 days in anticipation of the testing.   She was last seen in April 2025.  At that time, she presented with urticaria.  We obtained some lab work and started her on Allegra 2 tablets and Pepcid  1 tablet in the morning and Xyzal  2  tablets and Pepcid  1 tablet in the evening.  We did talk about doing a penicillin challenge.  I obtained labs to look for shellfish allergy .  For her rhinitis, we decided to do environmental allergy  testing.  Her lung testing looked great.  We started her on Breztri  instead of Breo 2 puffs twice daily.  Otherwise, there have been no changes to her past medical history, surgical history, family history, or social history.    Review of  systems otherwise negative other than that mentioned in the HPI.    Objective:   There were no vitals taken for this visit. There is no height or weight on file to calculate BMI.    Physical exam deferred since this was a skin testing appointment only.   Diagnostic studies:   Allergy  Studies:    Airborne Adult Perc - 12/31/23 1556     Time Antigen Placed 1556    Allergen Manufacturer Floyd Hutchinson    Location Back    Number of Test 55    1. Control-Buffer 50% Glycerol Negative    2. Control-Histamine 2+    3. Bahia Negative    4. [place] Negative    5. Johnson Negative    6. [place]  Blue 2+    7. Meadow Fescue Negative    8. Perennial Rye Negative    9. Timothy Negative    10. Ragweed Mix Negative    11. Cocklebur Negative    12. Plantain,  English Negative    13. Baccharis Negative    14. Dog Fennel Negative    15. Russian Thistle Negative    16. Lamb's Quarters Negative    17. Sheep Sorrell Negative    18. Rough Pigweed Negative    19. Marsh Elder, Rough Negative    20. Mugwort, Common Negative    21. Box, Elder Negative    22. Cedar, red 2+    23. Sweet Gum Negative    24. Pecan Pollen Negative    25. Pine Mix Negative    26. Walnut, Black Pollen Negative    27. Red Mulberry Negative    28. Ash Mix Negative    29. Birch Mix Negative    30. Beech American Negative    31. Cottonwood, [place] 2+    32. Hickory, White Negative    33. Maple Mix Negative    34. Oak, [place] Mix 2+    35. Sycamore Eastern 2+    36. Alternaria Alternata Negative    37. Cladosporium Herbarum Negative    38. Aspergillus Mix Negative    39. Penicillium Mix Negative    40. Bipolaris Sorokiniana (Helminthosporium) Negative    41. Drechslera Spicifera (Curvularia) Negative    42. Mucor Plumbeus Negative    43. Fusarium Moniliforme Negative    44. Aureobasidium Pullulans (pullulara) Negative    45. Rhizopus Oryzae Negative    46. Botrytis Cinera Negative    47. Epicoccum Nigrum  Negative    48. Phoma Betae Negative    49. Dust Mite Mix Negative    50. Cat Hair 10,000 BAU/ml Negative    51.  Dog Epithelia Negative    52. Mixed Feathers Negative    53. Horse Epithelia Negative    54. Cockroach, German Negative    55. Tobacco Leaf Negative             Intradermal - 12/31/23 1635     Time Antigen Placed 1645    Allergen Manufacturer Floyd Hutchinson    Location Arm  Number of Test 13    Control Negative    Bahia Negative    [place] Territ[place] Negative    Johnson Negative    Ragweed Mix Negative    Weed Mix Negative    Mold 1 Negative    Mold 2 Negative    Mold 3 Negative    Mold 4 Negative    Mite Mix Negative    Cat Negative    Dog Negative    Cockroach Negative             Allergy  testing results were read and interpreted by myself, documented by clinical staff.      Drexel Gentles, MD  [facility] 

## 2024-01-01 ENCOUNTER — Encounter: Payer: Self-pay | Admitting: Allergy & Immunology

## 2024-01-06 NOTE — Telephone Encounter (Signed)
 Spoke with patient---DOB verified---she informed me that instead of being scheduled for shellfish challenge it was supposed to be for food testing. Appointment was changed to skin testing on 02/04/24 at 1:30pm. Verbalized understanding.

## 2024-01-20 ENCOUNTER — Encounter: Payer: Self-pay | Admitting: Allergy & Immunology

## 2024-01-20 ENCOUNTER — Ambulatory Visit: Admitting: Allergy & Immunology

## 2024-01-20 VITALS — BP 128/90 | HR 88 | Temp 97.6°F | Resp 14 | Wt 296.5 lb

## 2024-01-20 DIAGNOSIS — R768 Other specified abnormal immunological findings in serum: Secondary | ICD-10-CM | POA: Diagnosis not present

## 2024-01-20 DIAGNOSIS — J310 Chronic rhinitis: Secondary | ICD-10-CM

## 2024-01-20 DIAGNOSIS — Z888 Allergy status to other drugs, medicaments and biological substances status: Secondary | ICD-10-CM | POA: Diagnosis not present

## 2024-01-20 DIAGNOSIS — J4530 Mild persistent asthma, uncomplicated: Secondary | ICD-10-CM

## 2024-01-20 DIAGNOSIS — L508 Other urticaria: Secondary | ICD-10-CM | POA: Diagnosis not present

## 2024-01-20 DIAGNOSIS — Z880 Allergy status to penicillin: Secondary | ICD-10-CM | POA: Diagnosis not present

## 2024-01-20 DIAGNOSIS — T380X5D Adverse effect of glucocorticoids and synthetic analogues, subsequent encounter: Secondary | ICD-10-CM

## 2024-01-20 NOTE — Progress Notes (Signed)
 FOLLOW UP  Date of Service/Encounter:  01/20/24   Assessment:   Corticosteroids adverse reaction - recommended avoiding dexamethasone    Chronic urticaria    Penicillin  allergy    Anaphylactic shock due to shellfish   Mild persistent asthma, uncomplicated  Plan/Recommendations:   1. Chronic urticaria - We will see what Dr. Rodell Citrin thinks about this.  - Chronic hives are often times a self limited process and will burn themselves out over 6-12 months, although this is not always the case.  - Restart suppressive dosing of antihistamines:   - Morning: Allegra (fexofenadine) 360mg  (two tablets) + Pepcid  (famotidine ) 20mg   - Evening: Xyzal  (levocetirizine) 10mg  (two tablets) + Pepcid  (famotidine ) 20mg  - You can change this dosing at home, d are tired of taking them every day, we can start treatment with a monthly injectable medication called Xolair.ecreasing the dose as needed or increasing the dosing as needed.   2. Penicillin  allergy  - You failed your penicillin  challenge, which surprised me. - You did respond well to the epinephrine  and antihistamines and steroids. - This certainly sounds like anaphylaxis.  - I would continue to avoid penicillins.  - Risk of biphasic reaction discussed. - She does have an EpiPen  at home if needed.   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  - Food sheet provided today.   4. Corticosteroids adverse reaction - Continue to avoid dexamethasone . - I don't want to challenge this by any means with your history.   5. Chronic rhinitis - possibly some improvement with Allegra  - Testing in the past showed: grasses and trees - Avoidance measures provided. - Continue with: all of the antihistamines for your hives/itching  6. Moderate persistent asthma, uncomplicated - Lung testing looked great at the last visit.  - Daily controller medication(s): Breztri  160/9/4.79mcg two puffs twice  daily - Prior to physical activity: albuterol  2 puffs 10-15 minutes before physical activity. - Rescue medications: albuterol  4 puffs every 4-6 hours as needed - Asthma control goals:  * Full participation in all desired activities (may need albuterol  before activity) * Albuterol  use two time or less a week on average (not counting use with activity) * Cough interfering with sleep two time or less a month * Oral steroids no more than once a year * No hospitalizations  7. Return in about 2 weeks (around 02/03/2024) for SKIN TESTING. You can have the follow up appointment with Dr. Idolina Maker or a Nurse Practicioner (our Nurse Practitioners are excellent and always have Physician oversight!).   Total of 30 minutes, greater than 50% of which was spent in discussion of treatment and management options.    Subjective:   Julie Hess is a 53 y.o. female presenting today for follow up of No chief complaint on file.   Julie Hess has a history of the following: Patient Active Problem List   Diagnosis Date Noted   Radiculopathy, lumbar region 06/11/2022   History of back surgery 06/11/2022   Radiculopathy 08/26/2018    History obtained from: chart review and patient.  Discussed the use of AI scribe software for clinical note transcription with the patient and/or guardian, who gave verbal consent to proceed.  Julie Hess is a 53 y.o. female presenting for a drug challenge. She has a history of hives to penicillin  in the distant past.   She is anxious about the challenge today.  She has been off of her antihistamines in anticipation for the visit today.  She  is planning to watch [employer] during the challenge today.  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  Her reaction to penicillin  centered on urticaria only. This was years ago.   Otherwise, there have been no changes to her past medical history, surgical history, family history,  or social history.    Review of systems otherwise negative other than that mentioned in the HPI.    Objective:   Blood pressure (!) 128/90, pulse 88, temperature 97.6 F (36.4 C), resp. rate 14, weight 296 lb 8 oz (134.5 kg), SpO2 97%. Body mass index is 52.52 kg/m.    Physical Exam Vitals reviewed.  Constitutional:      Appearance: She is well-developed.     Comments: Talkative.  HENT:     Head: Normocephalic and atraumatic.     Right Ear: Tympanic membrane, ear canal and external ear normal. No drainage, swelling or tenderness. Tympanic membrane is not injected, scarred, erythematous, retracted or bulging.     Left Ear: Tympanic membrane, ear canal and external ear normal. No drainage, swelling or tenderness. Tympanic membrane is not injected, scarred, erythematous, retracted or bulging.     Nose: No nasal deformity, septal deviation, mucosal edema or rhinorrhea.     Right Turbinates: Enlarged, swollen and pale.     Left Turbinates: Enlarged, swollen and pale.     Right Sinus: No maxillary sinus tenderness or frontal sinus tenderness.     Left Sinus: No maxillary sinus tenderness or frontal sinus tenderness.     Mouth/Throat:     Mouth: Mucous membranes are not pale and not dry.     Pharynx: Uvula midline.  Eyes:     General: Lids are normal. Allergic shiner present.        Right eye: No discharge.        Left eye: No discharge.     Conjunctiva/sclera: Conjunctivae normal.     Right eye: Right conjunctiva is not injected. No chemosis.    Left eye: Left conjunctiva is not injected. No chemosis.    Pupils: Pupils are equal, round, and reactive to light.  Cardiovascular:     Rate and Rhythm: Normal rate and regular rhythm.     Heart sounds: Normal heart sounds.  Pulmonary:     Effort: Pulmonary effort is normal. No tachypnea, accessory muscle usage or respiratory distress.     Breath sounds: Normal breath sounds. No wheezing, rhonchi or rales.  Chest:     Chest wall:  No tenderness.  Lymphadenopathy:     Head:     Right side of head: No submandibular, tonsillar or occipital adenopathy.     Left side of head: No submandibular, tonsillar or occipital adenopathy.     Cervical: No cervical adenopathy.  Skin:    General: Skin is warm.     Capillary Refill: Capillary refill takes less than 2 seconds.     Coloration: Skin is not pale.     Findings: Rash present. No abrasion, erythema or petechiae. Rash is not papular, urticarial or vesicular.     Comments: Upper chest and neck (resolved after treatment with epinephrine ).  Neurological:     Mental Status: She is alert.  Psychiatric:        Behavior: Behavior is cooperative.      Diagnostic studies:   Allergy  Studies:     Oral Challenge - 01/20/24 1100     Challenge Food/Drug Penicillin   Food/Drug provided by Patient    BP 128/90    Pulse 88    Respirations 14    Lungs 97%    Skin clear    Mouth clear    Time 0906    Dose 5mg =0.74mL    BP 138/82    Pulse 96    Respirations 16    Lungs 99%    Skin itchy around the skin    Comments Vitals @09 :19am    Time 0929    Comments (Neffy) Nasal Epinephrine  administed- Right Nostril    Time 0932    Comments Oral Cetirizine 20mg  and Prednisone  40mg  given    Time 0938    Dose Vitals    BP 130/82    Pulse 72    Respirations 18    Lungs 98%    Comments clear    Time 1025    Dose Final Vitals    BP 120/60    Pulse 78    Respirations 24    Lungs 100    Skin clear    Mouth clear             Unfortunately, around 10 minutes into the initial dose of 1% of the dose, she developed coughing, urticaria on her neck, and a sensation of throat closure.  She was very anxious.  We decided to get vitals which were normal.  We watched her for another 10 minutes, but the coughing got worse.  We ended up given her intranasal epinephrine  as well as cetirizine 20 mg and prednisone  40 mg.  She did report improvement within 5 to 10 minutes of getting the  nasal epinephrine .  Her physical exam was normal throughout the entire visit.  We did monitor her for 30 minutes after the epinephrine  was given.  She was back to her baseline before discharge. Vitals were normal at discharge, as they had been during the entirety of the visit.       Drexel Gentles, MD  [facility] 

## 2024-01-20 NOTE — Patient Instructions (Addendum)
 1. Chronic urticaria - We will see what Dr. Rodell Citrin thinks about this.  - Chronic hives are often times a self limited process and will burn themselves out over 6-12 months, although this is not always the case.  - Restart suppressive dosing of antihistamines:   - Morning: Allegra (fexofenadine) 360mg  (two tablets) + Pepcid  (famotidine ) 20mg   - Evening: Xyzal  (levocetirizine) 10mg  (two tablets) + Pepcid  (famotidine ) 20mg  - You can change this dosing at home, d are tired of taking them every day, we can start treatment with a monthly injectable medication called Xolair.ecreasing the dose as needed or increasing the dosing as needed.   2. Penicillin  allergy  - You failed your penicillin  challenge, which surprised me. - You were not supposed to do this!   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  - Food sheet provided today.   4. Corticosteroids adverse reaction - Continue to avoid dexamethasone . - I don't want to challenge this by any means with your history.   5. Chronic rhinitis - possibly some improvement with Allegra  - Testing in the past showed: grasses and trees - Avoidance measures provided. - Continue with: all of the antihistamines for your hives/itching  6. Moderate persistent asthma, uncomplicated - Lung testing looked great at the last visit.  - Daily controller medication(s): Breztri  160/9/4.31mcg two puffs twice daily - Prior to physical activity: albuterol  2 puffs 10-15 minutes before physical activity. - Rescue medications: albuterol  4 puffs every 4-6 hours as needed - Asthma control goals:  * Full participation in all desired activities (may need albuterol  before activity) * Albuterol  use two time or less a week on average (not counting use with activity) * Cough interfering with sleep two time or less a month * Oral steroids no more than once a year * No hospitalizations  7. Return in about 2 weeks (around  02/03/2024) for SKIN TESTING. You can have the follow up appointment with Dr. Idolina Maker or a Nurse Practicioner (our Nurse Practitioners are excellent and always have Physician oversight!).    Please inform us  of any Emergency Department visits, hospitalizations, or changes in symptoms. Call us  before going to the ED for breathing or allergy  symptoms since we might be able to fit you in for a sick visit. Feel free to contact us  anytime with any questions, problems, or concerns.  It was a pleasure to see you again today!  Websites that have reliable patient information: 1. American Academy of Asthma, Allergy , and Immunology: www.aaaai.org 2. Food Allergy  Research and Education (FARE): foodallergy.org 3. Mothers of Asthmatics: http://www.asthmacommunitynetwork.org 4. American College of Allergy , Asthma, and Immunology: www.acaai.org      "Like" us  on Facebook and Instagram for our latest updates!      A healthy democracy works best when [employer] participate! Make sure you are registered to vote! If you have moved or changed any of your contact information, you will need to get this updated before voting! Scan the QR codes below to learn more!

## 2024-01-22 MED ORDER — EPINEPHRINE 0.3 MG/0.3ML IJ SOAJ: 0.3000 mg | Freq: Once | INTRAMUSCULAR | 2 refills | Status: AC

## 2024-01-28 ENCOUNTER — Telehealth: Payer: Self-pay

## 2024-01-28 ENCOUNTER — Other Ambulatory Visit (HOSPITAL_COMMUNITY): Payer: Self-pay

## 2024-01-28 NOTE — Telephone Encounter (Signed)
 Pharmacy Patient Advocate Encounter   Received notification from Fax that prior authorization for Epinephrine  0.3mg  auto-injector is required/requested.   Insurance verification completed.   The patient is insured through [facility] .   Per test claim: The current 30 day co-pay is, $30.00.  No PA needed at this time. This test claim was processed through [facility]- copay amounts may vary at other pharmacies due to pharmacy/plan contracts, or as the patient moves through the different stages of their insurance plan.

## 2024-02-04 ENCOUNTER — Ambulatory Visit: Admitting: Allergy

## 2024-02-04 ENCOUNTER — Encounter: Payer: Self-pay | Admitting: Allergy

## 2024-02-04 DIAGNOSIS — T7802XD Anaphylactic reaction due to shellfish (crustaceans), subsequent encounter: Secondary | ICD-10-CM

## 2024-02-04 NOTE — Patient Instructions (Addendum)
 1. Chronic urticaria - Dr. Iva will see what Dr. Jeannetta thinks about this.  - Chronic hives are often times a self limited process and will burn themselves out over 6-12 months, although this is not always the case.  - Take suppressive dosing of antihistamines:   - Morning: Allegra (fexofenadine) 360mg  (two tablets) + Pepcid  (famotidine ) 20mg   - Evening: Xyzal  (levocetirizine) 10mg  (two tablets) + Pepcid  (famotidine ) 20mg  - You can change this dosing at home, and if you are tired of taking them every day, we can start treatment with a monthly injectable medication called Xolair.ecreasing the dose as needed or increasing the dosing as needed.   2. Penicillin  allergy  - You failed your penicillin  challenge.  Continue avoidance  3. Food allergy  - Food allergy  testing has slight reactive to grapes.   If you note any symptoms after eating grapes then you should avoid in the diet.  - Dr Iva did not feel you needed any labwork for these foods at this time.   - Your shellfish IgE labs were negative and skin testing also negative.  Dr Iva has recommended you can do an in-office food challenge to shellfish if you would like.  If interested, you can schedule with Dr Iva or with one of his nurse practitioners. For food challenge bring in 8-10 large shrimp cooked without breading and can season with salt/pepper to taste.  Hold antihistamines for 3 days prior to challenge.   4. Corticosteroids adverse reaction - Continue to avoid dexamethasone .  5. Chronic rhinitis - possibly some improvement with Allegra  - Testing in the past showed: grasses and trees - Avoidance measures provided. - Continue with: all of the antihistamines for your hives/itching  6. Moderate persistent asthma, uncomplicated - Daily controller medication(s): Breztri  160/9/4.47mcg two puffs twice daily - Prior to physical activity: albuterol  2 puffs 10-15 minutes before physical activity. - Rescue medications:  albuterol  4 puffs every 4-6 hours as needed - Asthma control goals:  * Full participation in all desired activities (may need albuterol  before activity) * Albuterol  use two time or less a week on average (not counting use with activity) * Cough interfering with sleep two time or less a month * Oral steroids no more than once a year * No hospitalizations  7.  Follow-up with Dr. Iva in 6 months or sooner if needed  Please inform us  of any Emergency Department visits, hospitalizations, or changes in symptoms. Call us  before going to the ED for breathing or allergy  symptoms since we might be able to fit you in for a sick visit. Feel free to contact us  anytime with any questions, problems, or concerns.

## 2024-02-04 NOTE — Progress Notes (Signed)
 Follow-up Note  RE: Julie Hess MRN: [id number] DOB: 20-Oct-1970 Date of Office Visit: 02/04/2024   History of present illness: Julie Hess is a 53 y.o. female presenting today for skin testing visit.  She was last seen in the office on 01/20/24 for chronic urticaria, penicillin  allergy , food allergy , corticosteroid adverse reaction, chronic rhinitis, asthma.  She is in her usual state of health today without recent illness.  She has held antihistamines for at least 3 days for testing today.   Medication List: Current Outpatient Medications  Medication Sig Dispense Refill   albuterol  (VENTOLIN  HFA) 108 (90 Base) MCG/ACT inhaler Inhale 1-2 puffs into the lungs every 6 (six) hours as needed for wheezing or shortness of breath.     B-D 3CC LUER-LOK SYR 25GX5/8 25G X 5/8 3 ML MISC INJECT AS DIRECTED 3 ML SYRINGES TO BE USED WITH BEFORE-12 INJECTIONS FOR 30 DAYS     BREO ELLIPTA  100-25 MCG/ACT AEPB Inhale 1 puff into the lungs daily.     budeson-glycopyrrolate -formoterol (BREZTRI  AEROSPHERE) 160-9-4.8 MCG/ACT AERO inhaler Inhale 2 puffs into the lungs in the morning and at bedtime. 1 each 5   cholecalciferol  (VITAMIN D3) 25 MCG (1000 UNIT) tablet Take 1,000 Units by mouth daily.     cyanocobalamin  (VITAMIN B12) 1000 MCG/ML injection Inject 1,000 mcg into the muscle.     cyanocobalamin  1000 MCG tablet Take 1,000 mcg by mouth daily.     diazepam  (VALIUM ) 5 MG tablet Take 1 tablet (5 mg total) by mouth every 6 (six) hours as needed for muscle spasms. 60 tablet 0   estradiol  (CLIMARA ) 0.06 MG/24HR Place 1 patch onto the skin every 7 (seven) days.     famotidine  (PEPCID ) 20 MG tablet Take 1 tablet (20 mg total) by mouth 2 (two) times daily. 180 tablet 1   fexofenadine (ALLEGRA) 180 MG tablet Take 180 mg by mouth daily.     furosemide  (LASIX ) 20 MG tablet Take 20 mg by mouth daily.     HYDROcodone -acetaminophen  (NORCO) 10-325 MG tablet Take 1-2 tablets by mouth every 4 (four) hours as  needed for moderate pain or severe pain. 60 tablet 0   hyoscyamine  (LEVSIN  SL) 0.125 MG SL tablet Take 0.125 mg by mouth every 6 (six) hours as needed for cramping.     levocetirizine (XYZAL ) 5 MG tablet Take 2 tablets (10 mg total) by mouth every evening. 180 tablet 1   losartan  (COZAAR ) 50 MG tablet Take 50 mg by mouth at bedtime.     montelukast  (SINGULAIR ) 10 MG tablet Take 10 mg by mouth at bedtime.     pantoprazole  (PROTONIX ) 40 MG tablet Take 40 mg by mouth 2 (two) times daily.     Semaglutide-Weight Management 0.25 MG/0.5ML SOAJ Inject 0.25 mg into the skin once a week.     sulfaSALAzine  (AZULFIDINE ) 500 MG tablet Take 500 mg by mouth 2 (two) times daily.     valACYclovir  (VALTREX ) 1000 MG tablet Take 1,000 mg by mouth at bedtime.     vitamin C  (ASCORBIC ACID ) 500 MG tablet Take 500 mg by mouth daily.     No current facility-administered medications for this visit.     Known medication allergies: Allergies  Allergen Reactions   Dexamethasone  Cough, Other (See Comments), Hives, Itching, Shortness Of Breath and Swelling   Penicillins Hives, Shortness Of Breath and Other (See Comments)    DID THE REACTION INVOLVE: Swelling of the face/tongue/throat, SOB, or low BP? #  #  #  YES  #  #  #  Sudden or severe rash/hives, skin peeling, or the inside of the mouth or nose? No Did it require medical treatment? No When did it last happen?  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   Shellfish Allergy  Hives, Itching, Swelling and Anaphylaxis   Other Other (See Comments)    Anti-depressants   Sulfamethoxazole-Trimethoprim Dermatitis   Oxycodone  Nausea Only    Prefers Hydrocodone  - Oxycodone  causes nausea and dizziness    Diagnostics/Labs:  Allergy  testing:   Food Adult Perc - 02/04/24 1300     Time Antigen Placed 1333    Allergen Manufacturer Jestine    Location Back    Number of allergen test 54     Control-buffer 50% Glycerol  Negative    Control-Histamine 2+    1. Peanut Negative    2. Soybean Negative    3. Wheat Negative    4. Sesame Negative    5. Milk, Cow Negative    7. Egg White, Chicken Negative    8. Shellfish Mix Negative    9. Fish Mix Negative    10. Cashew Negative    11. Walnut Food Negative    12. Almond Negative    14. Pecan Food Negative    17. Coconut Negative    18. Trout Negative    19. Tuna Negative    20. Salmon Negative    21. Flounder Negative    23. Shrimp Negative    24. Crab Negative    25. Lobster Negative    26. Oyster Negative    27. Scallops Negative    31. Rye  Negative    33. [place] Meat Negative    34. Chicken Meat Negative    35. Pork Negative    36. Beef Negative    38. Tomato Negative    39. White Potato Negative    40. Sweet Potato Negative    41. Pea, Green/English Negative    43. Green Beans Negative    45. Green Pepper Negative    46. Mushrooms Negative    47. Onion Negative    48. Avocado Negative    49. Cabbage Negative    50. Carrots Negative    52. Corn Negative    53. Cucumber Negative    54. Grape (White seedless) 2+   3x3   55. Orange  Negative    56. Lemon Negative    57. Banana Negative    58. Apple Negative    59. Peach Negative    60. Strawberry Negative    61. Blueberry Negative    62. Cherry Negative    63. Cantaloupe Negative    64. Watermelon Negative    65. Pineapple Negative    69. Ginger Negative    70. Garlic Negative          Allergy  testing results were read and interpreted by provider, documented by clinical staff.   Assessment and plan: 1. Chronic urticaria - Dr. Iva will see what Dr. Jeannetta thinks about this.  - Chronic hives are often times a self limited process and will burn themselves out over 6-12 months, although this is not always the case.  - Take suppressive dosing of antihistamines:   - Morning: Allegra (fexofenadine) 360mg  (two tablets) + Pepcid  (famotidine ) 20mg   - Evening: Xyzal   (levocetirizine) 10mg  (two tablets) + Pepcid  (famotidine ) 20mg  - You can change this dosing at home, and if you are tired of  taking them every day, we can start treatment with a monthly injectable medication called Xolair.ecreasing the dose as needed or increasing the dosing as needed.   2. Penicillin  allergy  - You failed your penicillin  challenge.  Continue avoidance  3. Food allergy  - Food allergy  testing has slight reactive to grapes.   If you note any symptoms after eating grapes then you should avoid in the diet.  - Dr Iva did not feel you needed any labwork for these foods at this time.   - Your shellfish IgE labs were negative and skin testing also negative.  Dr Iva has recommended you can do an in-office food challenge to shellfish if you would like.  If interested, you can schedule with Dr Iva or with one of his nurse practitioners. For food challenge bring in 8-10 large shrimp cooked without breading and can season with salt/pepper to taste.  Hold antihistamines for 3 days prior to challenge.   4. Corticosteroids adverse reaction - Continue to avoid dexamethasone .  5. Chronic rhinitis - possibly some improvement with Allegra  - Testing in the past showed: grasses and trees - Avoidance measures provided. - Continue with: all of the antihistamines for your hives/itching  6. Moderate persistent asthma, uncomplicated - Daily controller medication(s): Breztri  160/9/4.76mcg two puffs twice daily - Prior to physical activity: albuterol  2 puffs 10-15 minutes before physical activity. - Rescue medications: albuterol  4 puffs every 4-6 hours as needed - Asthma control goals:  * Full participation in all desired activities (may need albuterol  before activity) * Albuterol  use two time or less a week on average (not counting use with activity) * Cough interfering with sleep two time or less a month * Oral steroids no more than once a year * No hospitalizations  7.   Follow-up with Dr. Iva in 6 months or sooner if needed  I appreciate the opportunity to take part in Evah's care. Please do not hesitate to contact me with questions.  Sincerely,   Danita Brain, MD Allergy /Immunology [facility]

## 2024-03-17 ENCOUNTER — Other Ambulatory Visit: Payer: Self-pay | Admitting: Allergy & Immunology

## 2024-05-25 ENCOUNTER — Encounter: Payer: Self-pay | Admitting: Internal Medicine

## 2024-05-25 ENCOUNTER — Ambulatory Visit: Payer: Self-pay | Attending: Internal Medicine | Admitting: Internal Medicine

## 2024-05-25 VITALS — BP 140/80 | HR 70 | Temp 97.2°F | Resp 15 | Ht 63.0 in | Wt 300.0 lb

## 2024-05-25 DIAGNOSIS — H903 Sensorineural hearing loss, bilateral: Secondary | ICD-10-CM | POA: Insufficient documentation

## 2024-05-25 DIAGNOSIS — R7689 Other specified abnormal immunological findings in serum: Secondary | ICD-10-CM

## 2024-05-25 DIAGNOSIS — E7800 Pure hypercholesterolemia, unspecified: Secondary | ICD-10-CM | POA: Insufficient documentation

## 2024-05-25 DIAGNOSIS — I10 Essential (primary) hypertension: Secondary | ICD-10-CM | POA: Insufficient documentation

## 2024-05-25 NOTE — Patient Instructions (Signed)
 I recommend symptom treatments for eye dryness including lubricating eye drops and can use gel or ointment based products for overnight.  Also consider use of humidifier at night during dry weather.  Use follow-up regularly with your eye doctor.  If symptoms worsen there are several types of medicated eyedrop that can help with the dryness or inflammation.  For chronic dry mouth is important to stay well-hydrated.  You can also use sugar-free gum or lozenges to stimulate the saliva production.  Biotene mouthwash or lozenges may also be helpful.  Products into XyliMelts also work to stimulate saliva production.  Continue following up with your dentist regularly because dryness problems can increase the risk of tooth and gum decay.  If symptoms get worse there are medications for stimulating tear and saliva production but will try all the other options first.  I recommend checking out the [facility]  patient-centered guide for fibromyalgia and chronic pain management: https://howell-gardner.[email or web address]/

## 2024-05-25 NOTE — Progress Notes (Signed)
 Office Visit Note  Patient: Julie Hess             Date of Birth: Feb 23, 1971           MRN: [id number]             PCP: Rosalva Doffing, NP Referring: Iva Marty Saltness, * Visit Date: 05/25/2024 Occupation: Data Unavailable  Subjective:  New Patient (Initial Visit) (Abnormal labs)  Discussed the use of AI scribe software for clinical note transcription with the patient, who gave verbal consent to proceed.  History of Present Illness   Julie Hess is a 53 year old female with ulcerative colitis on sulfasalazine  who presents with chronic skin rashes and hives and associated positive ANA and elevated CRP tests.  She has experienced skin rashes and hives since her late teens, with episodes occurring multiple times a week. She reports that hives can flare up and last a couple of days, with locations varying, but she feels them most in her back and neck. Her current treatment includes Allegra, Xyzal , and Pepcid , which have been effective, and she uses hydroxyzine for severe episodes.  She has a history of ulcerative colitis, diagnosed in her twenties following a complicated pregnancy. Initially treated with multiple medications, she is now maintained on sulfasalazine , taking 500 mgtwice daily. She occasionally experiences mouth ulcers and sores, which have improved over time.  She experiences widespread body pain, originating in her back and affecting her entire body. She wakes at night with tingling and pain in her hands. Hydrocodone , prescribed for pain, is ineffective for both pain relief and sleep. No history of carpal tunnel syndrome and has not used medications like gabapentin  or duloxetine.  Her family history is significant for autoimmune diseases, with her sister having lupus and kidney issues, and her daughter having joint pain and being treated with hydroxychloroquine and prednisone  after a tick bite. She also has hypothyroidism, managed with medication, and experiences  frequent urination and occasional hematuria, for which she has consulted a urologist.  She has a positive ANA test and a slightly elevated CRP, identified during previous evaluations. A history of high alkaline phosphatase levels was noted by an endocrinologist.   She denies history of blood clots, abnormal bleeding, photosensitive rashes, adenopathy, or raynaud's symptoms  Labs reviewed ANA 1:320 speckled ESR wnl CRP 11 TPO/TG Ab neg  Activities of Daily Living:  Patient reports morning stiffness for 1 hour.   Patient Reports nocturnal pain.  Difficulty dressing/grooming: Reports Difficulty climbing stairs: Reports Difficulty getting out of chair: Reports Difficulty using hands for taps, buttons, cutlery, and/or writing: Reports  Review of Systems  Constitutional:  Positive for fatigue.  HENT:  Positive for mouth sores and mouth dryness.   Eyes:  Positive for dryness.  Respiratory:  Positive for shortness of breath.   Cardiovascular:  Negative for chest pain and palpitations.  Gastrointestinal:  Positive for blood in stool and diarrhea. Negative for constipation.  Endocrine: Positive for increased urination.  Genitourinary:  Negative for involuntary urination.  Musculoskeletal:  Positive for joint pain, gait problem, joint pain, joint swelling, myalgias, muscle weakness, morning stiffness, muscle tenderness and myalgias.  Skin:  Positive for rash, hair loss and sensitivity to sunlight. Negative for color change.  Allergic/Immunologic: Positive for susceptible to infections.  Neurological:  Positive for headaches. Negative for dizziness.  Hematological:  Negative for swollen glands.  Psychiatric/Behavioral:  Positive for depressed mood and sleep disturbance. The patient is nervous/anxious.     PMFS History:  Patient Active  Problem List   Diagnosis Date Noted   Hypercholesterolemia 05/25/2024   Hypertension 05/25/2024   Sensorineural hearing loss (SNHL) of both ears  05/25/2024   Prediabetes 03/29/2023   Vitamin D  insufficiency 03/29/2023   Dizziness 03/12/2023   Astigmatism with presbyopia, bilateral 11/11/2022   Punctate keratitis, bilateral 11/11/2022   Radiculopathy, lumbar region 06/11/2022   History of back surgery 06/11/2022   Gastroesophageal reflux disease 06/10/2022   Anxiety 02/19/2022   Chest discomfort 09/04/2021   Palpitations 09/04/2021   Bronchiolitis 08/30/2021   Dyspnea on exertion 08/30/2021   Class 3 drug-induced obesity with serious comorbidity and body mass index (BMI) of 50.0 to 59.9 in adult (HCC) 03/10/2021   Elevated alkaline phosphatase level 03/01/2021   PTSD (post-traumatic stress disorder) 03/01/2021   Abnormal stress echo 10/26/2018   Radiculopathy 08/26/2018   Obstructive sleep apnea syndrome 03/16/2017   Abscess of Bartholin's gland 07/04/2013   Asthma 06/22/2013   Complicated migraine 06/22/2013   History of hysterectomy for benign disease 06/22/2013   Hypothyroidism 06/22/2013   Ulcerative colitis (HCC) 06/22/2013    Past Medical History:  Diagnosis Date   Anemia    with ulcerative colitis   Angio-edema    Anxiety    Asthma    very mild per pt   Complication of anesthesia    Hard time waking up after C-Section 1997, but tolerated anesthesia since then no issues   Dyspnea    with activity   GERD (gastroesophageal reflux disease)    Hyperlipidemia    Hypertension    Hypothyroidism    Migraines    migraines   Recurrent upper respiratory infection (URI)    Sleep apnea    uses cpap   Spinal headache    Thyroid  disease    TIA (transient ischemic attack) 08/30/2021   TIA versus complicated migraine, hospitalized 08/31/21 for work[facility]up at [facility]   Ulcerative colitis, chronic (HCC)    Urticaria     Family History  Problem Relation Age of Onset   Eczema Mother    Asthma Mother    Allergic rhinitis Mother    Eczema Father    Asthma Father    Allergic rhinitis Father    Eczema  Sister    Asthma Sister    Allergic rhinitis Sister    Lupus Sister    Eczema Brother    Asthma Brother    Allergic rhinitis Brother    Past Surgical History:  Procedure Laterality Date   ABDOMINAL HYSTERECTOMY  2002   ANAL FISSURECTOMY  12/2021   ANTERIOR CERVICAL DECOMPRESSION/DISCECTOMY FUSION 4 LEVEL/HARDWARE REMOVAL N/A 08/26/2018   Procedure: ANTERIOR CERVICAL DECOMPRESSION FUSION, CERVICAL 4-5, CERVICAL 5-6, CERVICAL 6-7 WITH INSTRUMENTATION AND ALLOGRAFT;  Surgeon: Beuford Anes, MD;  Location: [facility] OR;  Service: Orthopedics;  Laterality: N/A;   ANTERIOR LAT LUMBAR FUSION Left 03/30/2019   Procedure: LEFT LATERAL LUMBAR THREE-FOUR INTERBODY FUSION WITH  ALLOGRAFT;  Surgeon: Beuford Anes, MD;  Location: [facility] OR;  Service: Orthopedics;  Laterality: Left;  LEFT LATERAL LUMBAR THREE-FOUR INTERBODY FUSION WITH  ALLOGRAFT   ANTERIOR LAT LUMBAR FUSION Right 06/11/2022   Procedure: RIGHT-SIDED LUMBAR TWO- LUMBAR THREE LATERAL INTERBODY FUSION WITH INSTRUMENTATION AND ALLOGRAFT;  Surgeon: Beuford Anes, MD;  Location: [facility] OR;  Service: Orthopedics;  Laterality: Right;   APPENDECTOMY     BACK SURGERY  2019   lumbar fusion   BACK SURGERY     discetomy   CESAREAN SECTION  1997   CHOLECYSTECTOMY     SPINE  SURGERY  08/11/1998   lumbar   Social History   Tobacco Use   Smoking status: Former    Current packs/day: 0.00    Types: Cigarettes    Quit date: 08/11/2004    Years since quitting: 19.8    Passive exposure: Past   Smokeless tobacco: Never  Vaping Use   Vaping status: Never Used  Substance Use Topics   Alcohol use: Yes    Comment: occasional   Drug use: No   Social History   Social History Narrative   Not on file     Immunization History  Administered Date(s) Administered   Fluad Trivalent(High Dose 65+) 05/01/2022   Hepatitis A, Adult 07/31/2021   Hepatitis A, Ped/Adol-2 Dose 01/10/2021   Influenza, Quadrivalent, Recombinant, Inj, Pf 08/01/2020   Influenza,  Seasonal, Injecte, Preservative Fre 08/04/2023   Influenza,inj,Quad PF,6+ Mos 05/18/2018, 07/31/2021   Influenza-Unspecified 05/18/2018, 07/31/2021   Moderna Sars-Covid-2 Vaccination 10/15/2019, 11/19/2019, 08/06/2020, 02/04/2021   Pneumococcal Polysaccharide-23 08/04/2023     Objective: Vital Signs: BP (!) 140/80 (BP Location: Left Arm, Patient Position: Sitting, Cuff Size: Normal)   Pulse 70   Temp (!) 97.2 F (36.2 C)   Resp 15   Ht 5' 3 (1.6 m)   Wt 300 lb (136.1 kg)   BMI 53.14 kg/m    Physical Exam HENT:     Mouth/Throat:     Comments: Sinus tenderness on the left side. Tonsils enlarged Eyes:     Conjunctiva/sclera: Conjunctivae normal.  Cardiovascular:     Rate and Rhythm: Normal rate and regular rhythm.  Pulmonary:     Effort: Pulmonary effort is normal.     Breath sounds: Normal breath sounds.  Lymphadenopathy:     Cervical: No cervical adenopathy.  Skin:    General: Skin is warm and dry.     Comments: Mottled/livedo reticularis skin pattern on extremities No digital pitting or telangiectasias  Neurological:     Mental Status: She is alert.  Psychiatric:        Mood and Affect: Mood normal.      Musculoskeletal Exam:  Shoulders full ROM no tenderness or swelling Tenderness to pressure extending from the lateral shoulder across forearm area with no palpable nodules or swelling, elbow range of motion normal Wrists full ROM no tenderness or swelling Fingers full ROM no tenderness or swelling, probable very early Heberden's nodes Back pain on palpation with radiating pain down the leg.  Widespread midline and bilateral paraspinal muscle tenderness to pressure Tenderness to pressure on lateral side of both hips Knees full ROM no tenderness or swelling Ankles full ROM no tenderness or swelling  Investigation: No additional findings.  Imaging: No results found.  Recent Labs: Lab Results  Component Value Date   WBC 10.2 05/22/2022   HGB 13.9 05/22/2022    PLT 312 05/22/2022   NA 140 12/09/2023   K 4.2 12/09/2023   CL 100 12/09/2023   CO2 26 12/09/2023   GLUCOSE 79 12/09/2023   BUN 8 12/09/2023   CREATININE 0.80 12/09/2023   BILITOT 0.3 12/09/2023   ALKPHOS 138 (H) 12/09/2023   AST 23 12/09/2023   ALT 25 12/09/2023   PROT 7.0 12/09/2023   ALBUMIN  4.4 12/09/2023   CALCIUM 9.6 12/09/2023   GFRAA >60 03/31/2019    Speciality Comments: No specialty comments available.  Procedures:  No procedures performed Allergies: Dexamethasone , Penicillins, Prazosin, Shellfish allergy , Other, and Sulfamethoxazole-trimethoprim   Assessment / Plan:     Visit Diagnoses: Positive ANA (antinuclear  antibody) - Plan: C3 and C4, RNP Antibody, Anti-Smith antibody, Sjogrens syndrome-A extractable nuclear antibody, Sjogrens syndrome-B extractable nuclear antibody, Anti-DNA antibody, double-stranded, C-reactive protein, Rheumatoid factor Positive ANA and elevated CRP indicate possible autoimmune process. Family history of lupus and personal history of ulcerative colitis increase suspicion.  Does not have any highly specific clinical criteria on physical exam although has extensive nonspecific symptoms.  Discussed dry eye and mouth management as well as possible relation with a primary or secondary sicca syndrome. - Order blood tests for specific antibodies and inflammatory markers. - Recheck CRP levels. - Evaluate complement levels. - On sulfasalazine  1 pill twice a day for UC, discussed possible overlap with treatment of extraintestinal inflammation.  Chronic urticaria Chronic urticaria with frequent episodes, effective management with current medications. Positive ANA and elevated CRP suggest autoimmune component. - Continue current regimen per allergy  recs- Allegra, Xyzal , and Pepcid , Use hydroxyzine as needed for severe episodes.  Myofascial pain syndrome Chronic myofascial pain syndrome with muscle and tendon irritation, no joint issues identified.   Widespread tenderness to pressure throughout her back and proximal extremities is consistent with a degree of fibromyalgia syndrome.  Unclear if bowel or bladder problems are also related but does have some headache and sleep disturbance patterns as well.  Degenerative disc disease of the lumbar spine Degenerative disc disease contributing to chronic back pain.  Early osteoarthritis of peripheral joints Early osteoarthritis in peripheral joints, likely genetic and activity-related.  Hypothyroidism Hypothyroidism managed with medication.        Orders: Orders Placed This Encounter  Procedures   C3 and C4   RNP Antibody   Anti-Smith antibody   Sjogrens syndrome-A extractable nuclear antibody   Sjogrens syndrome-B extractable nuclear antibody   Anti-DNA antibody, double-stranded   C-reactive protein   Rheumatoid factor   No orders of the defined types were placed in this encounter.    Follow-Up Instructions: Return in about 4 weeks (around 06/22/2024) for New pt +ANA/UC/?FMS f/u 67mo.   Lonni LELON Ester, MD  Note - This record has been created using [employer].  Chart creation errors have been sought, but may not always  have been located. Such creation errors do not reflect on  the standard of medical care.

## 2024-05-26 LAB — ANTI-DNA ANTIBODY, DOUBLE-STRANDED: ds DNA Ab: 1 [IU]/mL

## 2024-05-26 LAB — RHEUMATOID FACTOR: Rheumatoid fact SerPl-aCnc: <10 [IU]/mL (ref <14)

## 2024-05-26 LAB — ANTI-SMITH ANTIBODY: ENA SM Ab Ser-aCnc: <1 AI

## 2024-05-26 LAB — SJOGRENS SYNDROME-A EXTRACTABLE NUCLEAR ANTIBODY: SSA (Ro) (ENA) Antibody, IgG: <1 AI

## 2024-05-26 LAB — C-REACTIVE PROTEIN: CRP: 13 mg/L — ABNORMAL HIGH (ref <8.0)

## 2024-05-26 LAB — C3 AND C4
C3 Complement: 203 mg/dL — ABNORMAL HIGH (ref 83–193)
C4 Complement: 39 mg/dL (ref 15–57)

## 2024-05-26 LAB — SJOGRENS SYNDROME-B EXTRACTABLE NUCLEAR ANTIBODY: SSB (La) (ENA) Antibody, IgG: <1 AI

## 2024-05-26 LAB — RNP ANTIBODY: Ribonucleic Protein(ENA) Antibody, IgG: <1 AI

## 2024-05-27 ENCOUNTER — Encounter: Payer: Self-pay | Admitting: Internal Medicine

## 2024-06-05 ENCOUNTER — Other Ambulatory Visit: Payer: Self-pay | Admitting: Allergy & Immunology

## 2024-07-05 NOTE — Progress Notes (Signed)
 Office Visit Note  Patient: Julie Hess             Date of Birth: [birth year]           MRN: [id number]             PCP: Rosalva Doffing, NP Referring: Rosalva Doffing, NP Visit Date: 07/06/2024   Subjective:  Rash (Left side of her neck started yesterday during the school day.)   Discussed the use of AI scribe software for clinical note transcription with the patient, who gave verbal consent to proceed.  History of Present Illness   Julie Hess is a 53 year old female with ulcerative colitis who presents with concerns about elevated inflammatory markers and skin rashes. She does have a mixture of widespread body pains and also more localized neck and back pains.  She is experiencing elevated inflammatory markers, including a slightly elevated CRP and a fecal calprotectin level of 85, which is in the borderline elevated range. She is on sulfasalazine , taking two pills twice a day, and has been on this medication for some time after trying multiple other treatments that were ineffective. She has not been on steroids recently.  Allergy  testing did not reveal significant allergies, yet she continues to experience random hives. She has not been on any recent medications specifically for skin conditions.  She experiences bloody urination once or twice a week, typically during her first urination in the morning, without associated pain. This issue was previously evaluated by a urologist, but no cystoscopy was performed, and she has no history of kidney stones.  She experiences frequent lung infections, approximately every six weeks, often following a cold, which she attributes to her asthma. As a runner, broadcasting/film/video, she frequently gets sick, often requiring doxycycline for treatment.  Her past medical history includes migraines that can cause stroke-like symptoms, but she has no history of actual strokes or blood clots. She is not a smoker and has no significant coronary artery disease history.        Previous HPI 05/25/24 [name] is a 53 year old female with ulcerative colitis on sulfasalazine  who presents with chronic skin rashes and hives and associated positive ANA and elevated CRP tests.   She has experienced skin rashes and hives since her late teens, with episodes occurring multiple times a week. She reports that hives can flare up and last a couple of days, with locations varying, but she feels them most in her back and neck. Her current treatment includes Allegra, Xyzal , and Pepcid , which have been effective, and she uses hydroxyzine for severe episodes.   She has a history of ulcerative colitis, diagnosed in her twenties following a complicated pregnancy. Initially treated with multiple medications, she is now maintained on sulfasalazine , taking 500 mgtwice daily. She occasionally experiences mouth ulcers and sores, which have improved over time.   She experiences widespread body pain, originating in her back and affecting her entire body. She wakes at night with tingling and pain in her hands. Hydrocodone , prescribed for pain, is ineffective for both pain relief and sleep. No history of carpal tunnel syndrome and has not used medications like gabapentin  or duloxetine.   Her family history is significant for autoimmune diseases, with her sister having lupus and kidney issues, and her daughter having joint pain and being treated with hydroxychloroquine and prednisone  after a tick bite. She also has hypothyroidism, managed with medication, and experiences frequent urination and occasional hematuria, for which she has consulted a urologist.  She has a positive ANA test and a slightly elevated CRP, identified during previous evaluations. A history of high alkaline phosphatase levels was noted by an endocrinologist.    She denies history of blood clots, abnormal bleeding, photosensitive rashes, adenopathy, or raynaud's symptoms   Labs reviewed ANA 1:320 speckled ESR wnl CRP  11 TPO/TG Ab neg   Review of Systems  Constitutional:  Positive for fatigue.  HENT:  Positive for mouth sores and mouth dryness.   Eyes:  Negative for dryness.  Respiratory:  Negative for shortness of breath.   Cardiovascular:  Positive for palpitations. Negative for chest pain.  Gastrointestinal:  Positive for blood in stool and diarrhea. Negative for constipation.  Endocrine: Positive for increased urination.  Genitourinary:  Negative for involuntary urination.  Musculoskeletal:  Positive for joint pain, joint pain, joint swelling, myalgias, muscle weakness, morning stiffness, muscle tenderness and myalgias. Negative for gait problem.  Skin:  Positive for color change and rash. Negative for hair loss and sensitivity to sunlight.  Allergic/Immunologic: Positive for susceptible to infections.  Neurological:  Negative for dizziness and headaches.  Hematological:  Negative for swollen glands.  Psychiatric/Behavioral:  Positive for depressed mood and sleep disturbance. The patient is nervous/anxious.     PMFS History:  Patient Active Problem List   Diagnosis Date Noted   Hypercholesterolemia 05/25/2024   Hypertension 05/25/2024   Sensorineural hearing loss (SNHL) of both ears 05/25/2024   Prediabetes 03/29/2023   Vitamin D  insufficiency 03/29/2023   Dizziness 03/12/2023   Astigmatism with presbyopia, bilateral 11/11/2022   Punctate keratitis, bilateral 11/11/2022   Radiculopathy, lumbar region 06/11/2022   History of back surgery 06/11/2022   Gastroesophageal reflux disease 06/10/2022   Anxiety 02/19/2022   Chest discomfort 09/04/2021   Palpitations 09/04/2021   Bronchiolitis 08/30/2021   Dyspnea on exertion 08/30/2021   Class 3 drug-induced obesity with serious comorbidity and body mass index (BMI) of 50.0 to 59.9 in adult (HCC) 03/10/2021   Elevated alkaline phosphatase level 03/01/2021   PTSD (post-traumatic stress disorder) 03/01/2021   Abnormal stress echo 10/26/2018    Radiculopathy 08/26/2018   Obstructive sleep apnea syndrome 03/16/2017   Abscess of Bartholin's gland 07/04/2013   Asthma 06/22/2013   Complicated migraine 06/22/2013   History of hysterectomy for benign disease 06/22/2013   Hypothyroidism 06/22/2013   Ulcerative colitis (HCC) 06/22/2013    Past Medical History:  Diagnosis Date   Anemia    with ulcerative colitis   Angio-edema    Anxiety    Asthma    very mild per pt   Complication of anesthesia    Hard time waking up after C-Section 1997, but tolerated anesthesia since then no issues   Dyspnea    with activity   GERD (gastroesophageal reflux disease)    Hyperlipidemia    Hypertension    Hypothyroidism    Migraines    migraines   Recurrent upper respiratory infection (URI)    Sleep apnea    uses cpap   Spinal headache    Thyroid  disease    TIA (transient ischemic attack) 08/30/2021   TIA versus complicated migraine, hospitalized 08/31/21 for work-up at [facility]   Ulcerative colitis, chronic (HCC)    Urticaria     Family History  Problem Relation Age of Onset   Eczema Mother    Asthma Mother    Allergic rhinitis Mother    Eczema Father    Asthma Father    Allergic rhinitis Father    Eczema Sister  Asthma Sister    Allergic rhinitis Sister    Lupus Sister    Eczema Brother    Asthma Brother    Allergic rhinitis Brother    Past Surgical History:  Procedure Laterality Date   ABDOMINAL HYSTERECTOMY  2002   ANAL FISSURECTOMY  12/2021   ANTERIOR CERVICAL DECOMPRESSION/DISCECTOMY FUSION 4 LEVEL/HARDWARE REMOVAL N/A 08/26/2018   Procedure: ANTERIOR CERVICAL DECOMPRESSION FUSION, CERVICAL 4-5, CERVICAL 5-6, CERVICAL 6-7 WITH INSTRUMENTATION AND ALLOGRAFT;  Surgeon: Beuford Anes, MD;  Location: [facility] OR;  Service: Orthopedics;  Laterality: N/A;   ANTERIOR LAT LUMBAR FUSION Left 03/30/2019   Procedure: LEFT LATERAL LUMBAR THREE-FOUR INTERBODY FUSION WITH  ALLOGRAFT;  Surgeon: Beuford Anes, MD;   Location: [facility] OR;  Service: Orthopedics;  Laterality: Left;  LEFT LATERAL LUMBAR THREE-FOUR INTERBODY FUSION WITH  ALLOGRAFT   ANTERIOR LAT LUMBAR FUSION Right 06/11/2022   Procedure: RIGHT-SIDED LUMBAR TWO- LUMBAR THREE LATERAL INTERBODY FUSION WITH INSTRUMENTATION AND ALLOGRAFT;  Surgeon: Beuford Anes, MD;  Location: [facility] OR;  Service: Orthopedics;  Laterality: Right;   APPENDECTOMY     BACK SURGERY  2019   lumbar fusion   BACK SURGERY     discetomy   CESAREAN SECTION  1997   CHOLECYSTECTOMY     SPINE SURGERY  08/11/1998   lumbar   Social History   Social History Narrative   Not on file   Immunization History  Administered Date(s) Administered   Fluad Trivalent(High Dose 65+) 05/01/2022   Hepatitis A, Adult 07/31/2021   Hepatitis A, Ped/Adol-2 Dose 01/10/2021   Influenza, Quadrivalent, Recombinant, Inj, Pf 08/01/2020   Influenza, Seasonal, Injecte, Preservative Fre 08/04/2023   Influenza,inj,Quad PF,6+ Mos 05/18/2018, 07/31/2021   Influenza-Unspecified 05/18/2018, 07/31/2021   Moderna Sars-Covid-2 Vaccination 10/15/2019, 11/19/2019, 08/06/2020, 02/04/2021   Pneumococcal Polysaccharide-23 08/04/2023     Objective: Vital Signs: BP 133/77   Pulse 80   Temp (!) 97.3 F (36.3 C)   Resp 16   Ht 5' 3 (1.6 m)   Wt 299 lb 3.2 oz (135.7 kg)   BMI 53.00 kg/m    Physical Exam Eyes:     Conjunctiva/sclera: Conjunctivae normal.  Cardiovascular:     Rate and Rhythm: Normal rate and regular rhythm.  Pulmonary:     Effort: Pulmonary effort is normal.     Breath sounds: Normal breath sounds.  Lymphadenopathy:     Cervical: No cervical adenopathy.  Skin:    General: Skin is warm and dry.     Comments: Mottled/livedo reticularis skin pattern on extremities No digital pitting or telangiectasias   Neurological:     Mental Status: She is alert.  Psychiatric:        Mood and Affect: Mood normal.      Musculoskeletal Exam:  Shoulders full ROM no tenderness or  swelling Tenderness to pressure extending from the lateral shoulder across forearm area with no palpable nodules or swelling, elbow range of motion normal Wrists full ROM no tenderness or swelling Fingers full ROM no tenderness or swelling, very early Heberden's nodes Back pain on palpation with radiating pain down the leg.  Widespread midline and bilateral paraspinal muscle tenderness to pressure Tenderness to pressure on lateral side of both hips Knees full ROM no tenderness or swelling  Investigation: No additional findings.  Imaging: No results found.  Recent Labs: Lab Results  Component Value Date   WBC 10.2 05/22/2022   HGB 13.9 05/22/2022   PLT 312 05/22/2022   NA 140 12/09/2023   K 4.2 12/09/2023  CL 100 12/09/2023   CO2 26 12/09/2023   GLUCOSE 79 12/09/2023   BUN 8 12/09/2023   CREATININE 0.80 12/09/2023   BILITOT 0.3 12/09/2023   ALKPHOS 138 (H) 12/09/2023   AST 23 12/09/2023   ALT 25 12/09/2023   PROT 7.0 12/09/2023   ALBUMIN  4.4 12/09/2023   CALCIUM 9.6 12/09/2023   GFRAA >60 03/31/2019   QFTBGOLDPLUS NEGATIVE 07/06/2024    Speciality Comments: No specialty comments available.  Procedures:  No procedures performed Allergies: Dexamethasone , Penicillins, Prazosin, Shellfish allergy , Other, and Sulfamethoxazole-trimethoprim   Assessment / Plan:     Visit Diagnoses: Ulcerative proctitis with complication (HCC) Elevated CRP and fecal calprotectin indicate active inflammation. Current sulfasalazine  insufficient for active bowel disease as well as suspect her symptoms indicate extraintestinal manifestations. Considered Rinvoq for efficacy in treating ulcerative colitis and extraintestinal symptoms. Discussed Rinvoq side effects, including infection and shingles risk, malignancy, hyperlipidemia, MACE and VTE risk. Recommended Shingrix vaccine due to increased shingles risk. No major contraindications for Rinvoq. Coordination with gastroenterologist required. -  Contact gastroenterologist to discuss Rinvoq initiation. - Administer Shingrix vaccine before starting or soon after Rinvoq. - Monitor labs: WBC, kidney, liver function on Rinvoq.   High risk medication use - Plan: QuantiFERON-TB Gold Plus Checking TB screening baseline anticipating bDMARD or JAK inhibitor.       Orders: Orders Placed This Encounter  Procedures   QuantiFERON-TB Gold Plus   No orders of the defined types were placed in this encounter.    Follow-Up Instructions: Return in about 3 months (around 10/06/2024) for UC/IBD associated? [facility] start f/u 3mos.   Lonni LELON Ester, MD  Note - This record has been created using [employer].  Chart creation errors have been sought, but may not always  have been located. Such creation errors do not reflect on  the standard of medical care.

## 2024-07-06 ENCOUNTER — Encounter: Payer: Self-pay | Admitting: Internal Medicine

## 2024-07-06 ENCOUNTER — Ambulatory Visit: Attending: Internal Medicine | Admitting: Internal Medicine

## 2024-07-06 VITALS — BP 133/77 | HR 80 | Temp 97.3°F | Resp 16 | Ht 63.0 in | Wt 299.2 lb

## 2024-07-06 DIAGNOSIS — Z79899 Other long term (current) drug therapy: Secondary | ICD-10-CM | POA: Diagnosis not present

## 2024-07-06 DIAGNOSIS — K51219 Ulcerative (chronic) proctitis with unspecified complications: Secondary | ICD-10-CM

## 2024-07-06 NOTE — Patient Instructions (Signed)
 SABRA

## 2024-07-10 LAB — QUANTIFERON-TB GOLD PLUS
Mitogen-NIL: 8.56 [IU]/mL
NIL: 0.01 [IU]/mL
QuantiFERON-TB Gold Plus: NEGATIVE
TB1-NIL: 0.01 [IU]/mL
TB2-NIL: 0.01 [IU]/mL

## 2024-07-27 ENCOUNTER — Ambulatory Visit: Admitting: Allergy & Immunology

## 2024-07-27 ENCOUNTER — Other Ambulatory Visit: Payer: Self-pay

## 2024-07-27 ENCOUNTER — Encounter: Payer: Self-pay | Admitting: Allergy & Immunology

## 2024-07-27 VITALS — BP 126/84 | HR 72 | Temp 97.6°F | Wt 304.6 lb

## 2024-07-27 DIAGNOSIS — J310 Chronic rhinitis: Secondary | ICD-10-CM

## 2024-07-27 DIAGNOSIS — T7802XD Anaphylactic reaction due to shellfish (crustaceans), subsequent encounter: Secondary | ICD-10-CM | POA: Diagnosis not present

## 2024-07-27 DIAGNOSIS — R7689 Other specified abnormal immunological findings in serum: Secondary | ICD-10-CM | POA: Diagnosis not present

## 2024-07-27 DIAGNOSIS — J4530 Mild persistent asthma, uncomplicated: Secondary | ICD-10-CM

## 2024-07-27 DIAGNOSIS — B999 Unspecified infectious disease: Secondary | ICD-10-CM

## 2024-07-27 DIAGNOSIS — L508 Other urticaria: Secondary | ICD-10-CM

## 2024-07-27 NOTE — Patient Instructions (Addendum)
 1. Chronic urticaria - I would consider adding on a stronger medication to manage your hives (either Xolair or Rhapsido). - This would help decrease your hives and improve your quality of life. - I do not think that food is related to all of this.  - Continue suppressive dosing of antihistamines:   - Morning: Allegra (fexofenadine) 360mg  (two tablets) + Pepcid  (famotidine ) 20mg   - Evening: Xyzal  (levocetirizine) 10mg  (two tablets) + Pepcid  (famotidine ) 20mg  - You can change this dosing at home, decreasing the dose as needed or increasing the dosing as needed.  - We are going to get labs to look for Mast Cell Activation Syndrome.   2. Penicillin  allergy  - Continue to avoid penicillin .  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   4. Corticosteroids adverse reaction - Continue to avoid dexamethasone .  5. Chronic rhinitis - possibly some improvement with Allegra  - Testing in the past showed: grasses and trees - Avoidance measures provided. - Continue with: all of the antihistamines for your hives/itching  6. Moderate persistent asthma, uncomplicated - Lung testing looked great at the last visit.  - Daily controller medication(s): Breo 100/61mcg one puff once daily - Prior to physical activity: albuterol  2 puffs 10-15 minutes before physical activity. - Rescue medications: albuterol  4 puffs every 4-6 hours as needed - Asthma control goals:  * Full participation in all desired activities (may need albuterol  before activity) * Albuterol  use two time or less a week on average (not counting use with activity) * Cough interfering with sleep two time or less a month * Oral steroids no more than once a year * No hospitalizations  7. Recurrent infections - We will obtain some screening labs to evaluate your immune system.  - Labs to evaluate the quantitative [facility]) aspects of your immune system: IgG/IgA/IgM, CBC with differential - Labs to evaluate the  qualitative (HOW WELL THEY WORK) aspects of your immune system: CH50, Pneumococcal titers, Tetanus titers, Diphtheria titers - We may consider immunizations with Pneumovax and Tdap to challenge your immune system, and then obtain repeat titers in 4-6 weeks.   8. Return in about 4 weeks (around 08/24/2024). You can have the follow up appointment with Dr. Iva or a Nurse Practicioner (our Nurse Practitioners are excellent and always have Physician oversight!).    Please inform us  of any Emergency Department visits, hospitalizations, or changes in symptoms. Call us  before going to the ED for breathing or allergy  symptoms since we might be able to fit you in for a sick visit. Feel free to contact us  anytime with any questions, problems, or concerns.  It was a pleasure to see you again today!  Websites that have reliable patient information: 1. American Academy of Asthma, Allergy , and Immunology: www.aaaai.org 2. Food Allergy  Research and Education (FARE): foodallergy.org 3. Mothers of Asthmatics: http://www.asthmacommunitynetwork.org 4. [employer] of Allergy , Asthma, and Immunology: www.acaai.org      Like us  on [employer] and Instagram for our latest updates!      A healthy democracy works best when [employer] participate! Make sure you are registered to vote! If you have moved or changed any of your contact information, you will need to get this updated before voting! Scan the QR codes below to learn more!

## 2024-07-27 NOTE — Progress Notes (Unsigned)
 FOLLOW UP  Date of Service/Encounter:  07/27/2024   Assessment:   Corticosteroids adverse reaction - recommended avoiding dexamethasone    Chronic urticaria    Penicillin  allergy    Anaphylactic shock due to shellfish   Mild persistent asthma, uncomplicated  Ulcerative colitis - on sulfasalazine  BID  Recurrent infections - mostly sinopulmonary infections (diagnosed at [facility] for the most part)   Plan/Recommendations:   Patient Instructions  1. Chronic urticaria - I would consider adding on a stronger medication to manage your hives (either Xolair or Rhapsido). - This would help decrease your hives and improve your quality of life. - I do not think that food is related to all of this.  - Continue suppressive dosing of antihistamines:   - Morning: Allegra (fexofenadine) 360mg  (two tablets) + Pepcid  (famotidine ) 20mg   - Evening: Xyzal  (levocetirizine) 10mg  (two tablets) + Pepcid  (famotidine ) 20mg  - You can change this dosing at home, d are tired of taking them every day, we can start treatment with a monthly injectable medication called Xolair.ecreasing the dose as needed or increasing the dosing as needed.   2. Penicillin  allergy  - Continue to avoid penicillin .  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   4. Corticosteroids adverse reaction - Continue to avoid dexamethasone .  5. Chronic rhinitis - possibly some improvement with Allegra  - Testing in the past showed: grasses and trees - Avoidance measures provided. - Continue with: all of the antihistamines for your hives/itching  6. Moderate persistent asthma, uncomplicated - Lung testing looked great at the last visit.  - Daily controller medication(s): Breo 100/41mcg one puff once daily and Breztri  160/9/4.79mcg two puffs twice daily - Prior to physical activity: albuterol  2 puffs 10-15 minutes before physical activity. - Rescue medications: albuterol  4 puffs every 4-6 hours as  needed - Asthma control goals:  * Full participation in all desired activities (may need albuterol  before activity) * Albuterol  use two time or less a week on average (not counting use with activity) * Cough interfering with sleep two time or less a month * Oral steroids no more than once a year * No hospitalizations  7. Recurrent infections - We will obtain some screening labs to evaluate your immune system.  - Labs to evaluate the quantitative [facility]) aspects of your immune system: IgG/IgA/IgM, CBC with differential - Labs to evaluate the qualitative (HOW WELL THEY WORK) aspects of your immune system: CH50, Pneumococcal titers, Tetanus titers, Diphtheria titers - We may consider immunizations with Pneumovax and Tdap to challenge your immune system, and then obtain repeat titers in 4-6 weeks.   8. Return in about 4 weeks (around 08/24/2024). You can have the follow up appointment with Dr. Iva or a Nurse Practicioner (our Nurse Practitioners are excellent and always have Physician oversight!).    Please inform us  of any Emergency Department visits, hospitalizations, or changes in symptoms. Call us  before going to the ED for breathing or allergy  symptoms since we might be able to fit you in for a sick visit. Feel free to contact us  anytime with any questions, problems, or concerns.  It was a pleasure to see you again today!  Websites that have reliable patient information: 1. American Academy of Asthma, Allergy , and Immunology: www.aaaai.org 2. Food Allergy  Research and Education (FARE): foodallergy.org 3. Mothers of Asthmatics: http://www.asthmacommunitynetwork.org 4. American College of Allergy , Asthma, and Immunology: www.acaai.org      Like us  on [employer] and Instagram for our latest updates!  A healthy democracy works best when [employer] participate! Make sure you are registered to vote! If you have moved or changed any of your contact information, you will need  to get this updated before voting! Scan the QR codes below to learn more!           Subjective:   Julie Hess is a 53 y.o. 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/2022   Radiculopathy, lumbar region 06/11/2022   History of back surgery 06/11/2022   Gastroesophageal reflux disease 06/10/2022   Anxiety 02/19/2022   Chest discomfort 09/04/2021   Palpitations 09/04/2021   Bronchiolitis 08/30/2021   Dyspnea on exertion 08/30/2021   Class 3 drug-induced obesity with serious comorbidity and body mass index (BMI) of 50.0 to 59.9 in adult (HCC) 03/10/2021   Elevated alkaline phosphatase level 03/01/2021   PTSD (post-traumatic stress disorder) 03/01/2021   Abnormal stress echo 10/26/2018   Radiculopathy 08/26/2018   Obstructive sleep apnea syndrome 03/16/2017   Abscess of Bartholin's gland 07/04/2013   Asthma 06/22/2013   Complicated migraine 06/22/2013   History of hysterectomy for benign disease 06/22/2013   Hypothyroidism 06/22/2013   Ulcerative colitis (HCC) 06/22/2013    History obtained from: chart review and {Persons; PED relatives w/patient:19415::patient}.  Discussed the use of AI scribe software for clinical note transcription with the patient and/or guardian, who gave verbal consent to proceed.  Julie Hess is a 53 y.o. female presenting for {Blank single:19197::a food challenge,a drug challenge,skin testing,a sick visit,an evaluation of ***,a  follow up visit}.  She was last seen in June or 2025 for testing.  At that time, she was negative to most of the food testing aside.   Asthma/Respiratory Symptom History: ***  Allergic Rhinitis Symptom History: ***  Food Allergy  Symptom History: ***  Skin Symptom History: ***  GERD Symptom History: ***  Infection Symptom History: ***  Otherwise, there have been no changes to her past medical history, surgical history, family history, or social history.    Review of systems otherwise negative other than that mentioned in the HPI.    Objective:   Blood pressure 126/84, pulse 72, temperature 97.6 F (36.4 C), temperature source Temporal, weight (!) 304 lb 9.6 oz (138.2 kg), SpO2 97%. Body mass index is 53.96 kg/m.    Physical Exam   Diagnostic studies: {Blank single:19197::none,deferred due to recent antihistamine use,deferred due to insurance stipulations that require a separate visit for testing,labs sent instead, }  Spirometry: {Blank single:19197::results normal (FEV1: ***%, FVC: ***%, FEV1/FVC: ***%),results abnormal (FEV1: ***%, FVC: ***%, FEV1/FVC: ***%)}.    {Blank single:19197::Spirometry consistent with mild obstructive disease,Spirometry consistent with moderate obstructive disease,Spirometry consistent with severe obstructive disease,Spirometry consistent with possible restrictive disease,Spirometry consistent with mixed obstructive and restrictive disease,Spirometry uninterpretable due to technique,Spirometry consistent with normal pattern}. {Blank single:19197::Albuterol /Atrovent  nebulizer,Xopenex/Atrovent  nebulizer,Albuterol  nebulizer,Albuterol  four puffs via MDI,Xopenex four puffs via MDI} treatment given in clinic with {Blank single:19197::significant improvement in FEV1 per ATS criteria,significant improvement in FVC per ATS criteria,significant improvement  in FEV1 and FVC per ATS criteria,improvement in FEV1, but not  significant per ATS criteria,improvement in FVC, but not significant per ATS criteria,improvement in FEV1 and FVC, but not significant per ATS criteria,no improvement}.  Allergy  Studies: {Blank single:19197::none,deferred due to recent antihistamine use,deferred due to insurance stipulations that require a separate visit for testing,labs sent instead, }    {Blank single:19197::Allergy  testing results were read and interpreted by myself, documented by clinical staff., }      Marty Shaggy, MD  [facility] 

## 2024-08-03 LAB — 2,3-DINOR 11BPG F2, 24HR URINE

## 2024-08-03 LAB — LEUKOTRIENE E4, 24 HR, U

## 2024-08-03 LAB — N-METHYLHISTAMINE, 24 HR, U

## 2024-08-05 LAB — LYMPH ENUMERATION, BASIC & NK CELLS
% CD 3 Pos. Lymph.: 81.2 % (ref 57.5–86.2)
% CD 4 Pos. Lymph.: 64.6 % — ABNORMAL HIGH (ref 30.8–58.5)
% NK (CD56/16): 7.3 % (ref 1.4–19.4)
Ab NK (CD56/16): 190 /uL (ref 24–406)
Absolute CD 3: 2111 /uL (ref 622–2402)
Absolute CD 4 Helper: 1680 /uL — ABNORMAL HIGH (ref 359–1519)
Basophils Absolute: 0.1 x10E3/uL (ref 0.0–0.2)
Basos: 1 %
CD19 % B Cell: 11.3 % (ref 3.3–25.4)
CD19 Abs: 294 /uL (ref 12–645)
CD4/CD8 Ratio: 3.78 — ABNORMAL HIGH (ref 0.92–3.72)
CD8 % Suppressor T Cell: 17.1 % (ref 12.0–35.5)
CD8 T Cell Abs: 445 /uL (ref 109–897)
EOS (ABSOLUTE): 0.1 x10E3/uL (ref 0.0–0.4)
Eos: 1 %
Hematocrit: 39.4 % (ref 34.0–46.6)
Hemoglobin: 12.6 g/dL (ref 11.1–15.9)
Immature Grans (Abs): 0 x10E3/uL (ref 0.0–0.1)
Immature Granulocytes: 0 %
Lymphocytes Absolute: 2.6 x10E3/uL (ref 0.7–3.1)
Lymphs: 39 %
MCH: 29.4 pg (ref 26.6–33.0)
MCHC: 32 g/dL (ref 31.5–35.7)
MCV: 92 fL (ref 79–97)
Monocytes Absolute: 0.4 x10E3/uL (ref 0.1–0.9)
Monocytes: 6 %
Neutrophils Absolute: 3.5 x10E3/uL (ref 1.4–7.0)
Neutrophils: 53 %
Platelets: 325 x10E3/uL (ref 150–450)
RBC: 4.28 x10E6/uL (ref 3.77–5.28)
RDW: 14.8 % (ref 11.7–15.4)
WBC: 6.7 x10E3/uL (ref 3.4–10.8)

## 2024-08-05 LAB — DIPHTHERIA / TETANUS ANTIBODY PANEL
Diphtheria Ab: 0.57 [IU]/mL
Tetanus Ab, IgG: 0.73 [IU]/mL

## 2024-08-05 LAB — STREP PNEUMONIAE 23 SEROTYPES IGG
Pneumo Ab Type 1*: >12.2 ug/mL
Pneumo Ab Type 12 (12F)*: 1.4 ug/mL
Pneumo Ab Type 14*: 1.1 ug/mL — ABNORMAL LOW
Pneumo Ab Type 17 (17F)*: >30.6 ug/mL
Pneumo Ab Type 19 (19F)*: 5.2 ug/mL
Pneumo Ab Type 2*: >45.1 ug/mL
Pneumo Ab Type 20*: 12.6 ug/mL
Pneumo Ab Type 22 (22F)*: 3.7 ug/mL
Pneumo Ab Type 23 (23F)*: 0.3 ug/mL — ABNORMAL LOW
Pneumo Ab Type 26 (6B)*: 0.3 ug/mL — ABNORMAL LOW
Pneumo Ab Type 3*: 0.5 ug/mL — ABNORMAL LOW
Pneumo Ab Type 34 (10A)*: 3.9 ug/mL
Pneumo Ab Type 4*: 0.2 ug/mL — ABNORMAL LOW
Pneumo Ab Type 43 (11A)*: >9.2 ug/mL
Pneumo Ab Type 5*: 7.3 ug/mL
Pneumo Ab Type 51 (7F)*: 2.1 ug/mL
Pneumo Ab Type 54 (15B)*: 11.3 ug/mL
Pneumo Ab Type 56 (18C)*: 1.6 ug/mL
Pneumo Ab Type 57 (19A)*: 2.4 ug/mL
Pneumo Ab Type 68 (9V)*: 5 ug/mL
Pneumo Ab Type 70 (33F)*: 5.7 ug/mL
Pneumo Ab Type 8*: 18.9 ug/mL
Pneumo Ab Type 9 (9N)*: 3.2 ug/mL

## 2024-08-05 LAB — IGG, IGA, IGM
IgG (Immunoglobin G), Serum: 771 mg/dL (ref 586–1602)
IgM (Immunoglobulin M), Srm: 51 mg/dL (ref 26–217)
Immunoglobulin A, (IgA) QN, Serum: 262 mg/dL (ref 87–352)

## 2024-08-05 LAB — COMPLEMENT, TOTAL: Compl, Total (CH50): >60 U/mL

## 2024-08-05 LAB — T-HELPER CELLS CD4/CD8 %
% CD 4 Pos. Lymph.: 68.8 % — ABNORMAL HIGH (ref 30.8–58.5)
Absolute CD 4 Helper: 1789 /uL — ABNORMAL HIGH (ref 359–1519)
CD3+CD4+ Cells/CD3+CD8+ Cells Bld: 4.02 — ABNORMAL HIGH (ref 0.92–3.72)
CD3+CD8+ Cells # Bld: 445 /uL (ref 109–897)
CD3+CD8+ Cells NFr Bld: 17.1 % (ref 12.0–35.5)

## 2024-08-06 ENCOUNTER — Encounter: Payer: Self-pay | Admitting: Allergy & Immunology

## 2024-08-06 LAB — 2,3-DINOR 11BPG F2, 24HR URINE

## 2024-08-06 LAB — LEUKOTRIENE E4, 24 HR, U

## 2024-08-06 LAB — N-METHYLHISTAMINE, 24 HR, U

## 2024-08-10 LAB — ALPHA-GAL PANEL
Allergen Lamb IgE: <0.1 kU/L
Beef IgE: <0.1 kU/L
IgE (Immunoglobulin E), Serum: <2 [IU]/mL — ABNORMAL LOW (ref 6–495)
O215-IgE Alpha-Gal: <0.1 kU/L
Pork IgE: <0.1 kU/L

## 2024-08-10 LAB — TRYPTASE: Tryptase: 3.3 ug/L (ref 2.2–13.2)

## 2024-08-10 LAB — PROSTAGLANDIN D2, SERUM: Prostaglandin D2, serum: 19 pg/mL

## 2024-08-13 LAB — LEUKOTRIENE E4, 24 HR, U

## 2024-08-13 LAB — 2,3-DINOR 11BPG F2, 24HR URINE

## 2024-08-13 LAB — PROSTAGLANDIN D2/CREATININE, U
Creatinine, Urine: 46 mg/dL
Prostaglandin D2, urine: 3.7 pg/mL
Prostaglandin D2/Cr Ratio: 8 ng/g

## 2024-08-13 LAB — N-METHYLHISTAMINE, 24 HR, U

## 2024-08-30 ENCOUNTER — Encounter: Payer: Self-pay | Admitting: Allergy & Immunology

## 2024-08-30 ENCOUNTER — Other Ambulatory Visit: Payer: Self-pay

## 2024-08-30 ENCOUNTER — Ambulatory Visit: Admitting: Allergy & Immunology

## 2024-08-30 VITALS — BP 128/82 | HR 72 | Temp 97.9°F | Resp 20

## 2024-08-30 DIAGNOSIS — B999 Unspecified infectious disease: Secondary | ICD-10-CM | POA: Diagnosis not present

## 2024-08-30 DIAGNOSIS — L508 Other urticaria: Secondary | ICD-10-CM

## 2024-08-30 DIAGNOSIS — R7689 Other specified abnormal immunological findings in serum: Secondary | ICD-10-CM | POA: Diagnosis not present

## 2024-08-30 MED ORDER — FAMOTIDINE 20 MG PO TABS: 20.0000 mg | ORAL_TABLET | Freq: Two times a day (BID) | ORAL | 5 refills | Status: AC

## 2024-08-30 MED ORDER — ALBUTEROL SULFATE HFA 108 (90 BASE) MCG/ACT IN AERS: 2.0000 | INHALATION_SPRAY | RESPIRATORY_TRACT | 1 refills | Status: AC | PRN

## 2024-08-30 MED ORDER — BREO ELLIPTA 100-25 MCG/ACT IN AEPB: 1.0000 | INHALATION_SPRAY | Freq: Every day | RESPIRATORY_TRACT | 5 refills | Status: AC

## 2024-08-30 NOTE — Patient Instructions (Addendum)
 1. Chronic urticaria - Let's start Rhapsido to help calm down your hives.  - This might help you to get off of the antihistamines. - Continue suppressive dosing of antihistamines:   - Morning: Allegra (fexofenadine) 360mg  (two tablets) + Pepcid  (famotidine ) 20mg   - Evening: Xyzal  (levocetirizine) 10mg  (two tablets) + Pepcid  (famotidine ) 20mg  - You can change this dosing at home, decreasing the dose as needed or increasing the dosing as needed.  - Mast cell Activation labs were all negative, which is good news.   2. Penicillin  allergy  - Continue to avoid penicillin .  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   4. Corticosteroids adverse reaction - Continue to avoid dexamethasone .  5. Chronic rhinitis - possibly some improvement with Allegra  - Testing in the past showed: grasses and trees - Avoidance measures provided. - Continue with: all of the antihistamines for your hives/itching  6. Moderate persistent asthma, uncomplicated - Lung testing looked great at the last visit.  - Daily controller medication(s): Breo 100/48mcg one puff once daily - Prior to physical activity: albuterol  2 puffs 10-15 minutes before physical activity. - Rescue medications: albuterol  4 puffs every 4-6 hours as needed - Asthma control goals:  * Full participation in all desired activities (may need albuterol  before activity) * Albuterol  use two time or less a week on average (not counting use with activity) * Cough interfering with sleep two time or less a month * Oral steroids no more than once a year * No hospitalizations  7. Recurrent infections - Thankfully, your immune system all looked great for the most part. - You did have an elevated CD4 T cell count (but we worry more about lower numbers here). - You were protective against Streptococcus pneumoniae as well as Tetanus and Diptheria, which is great news.  - We are going to get genetic testing to look for over 500  causes of immune dysregulation.  8. Return in about 3 months (around 11/28/2024). You can have the follow up appointment with Dr. Iva or a Nurse Practicioner (our Nurse Practitioners are excellent and always have Physician oversight!).    Please inform us  of any Emergency Department visits, hospitalizations, or changes in symptoms. Call us  before going to the ED for breathing or allergy  symptoms since we might be able to fit you in for a sick visit. Feel free to contact us  anytime with any questions, problems, or concerns.  It was a pleasure to see you again today!  Websites that have reliable patient information: 1. American Academy of Asthma, Allergy , and Immunology: www.aaaai.org 2. Food Allergy  Research and Education (FARE): foodallergy.org 3. Mothers of Asthmatics: http://www.asthmacommunitynetwork.org 4. American College of Allergy , Asthma, and Immunology: www.acaai.org      Like us  on [employer] and Instagram for our latest updates!      A healthy democracy works best when [employer] participate! Make sure you are registered to vote! If you have moved or changed any of your contact information, you will need to get this updated before voting! Scan the QR codes below to learn more!

## 2024-08-30 NOTE — Progress Notes (Unsigned)
 "  FOLLOW UP  Date of Service/Encounter:  09/01/24   Assessment:   Corticosteroids adverse reaction - recommended avoiding dexamethasone    Chronic urticaria    Penicillin  allergy    Anaphylactic shock due to shellfish   Mild persistent asthma, uncomplicated   Ulcerative colitis - on sulfasalazine  BID   Recurrent infections - mostly sinopulmonary infections (diagnosed at [facility] for the most part)   Plan/Recommendations:   1. Chronic urticaria - Let's start Rhapsido to help calm down your hives.  - This might help you to get off of the antihistamines. - Continue suppressive dosing of antihistamines:   - Morning: Allegra (fexofenadine) 360mg  (two tablets) + Pepcid  (famotidine ) 20mg   - Evening: Xyzal  (levocetirizine) 10mg  (two tablets) + Pepcid  (famotidine ) 20mg  - You can change this dosing at home, decreasing the dose as needed or increasing the dosing as needed.  - Mast cell Activation labs were all negative, which is good news.   2. Penicillin  allergy  - Continue to avoid penicillin .  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   4. Corticosteroids adverse reaction - Continue to avoid dexamethasone .  5. Chronic rhinitis - possibly some improvement with Allegra  - Testing in the past showed: grasses and trees - Avoidance measures provided. - Continue with: all of the antihistamines for your hives/itching  6. Moderate persistent asthma, uncomplicated - Lung testing looked great at the last visit.  - Daily controller medication(s): Breo 100/37mcg one puff once daily - Prior to physical activity: albuterol  2 puffs 10-15 minutes before physical activity. - Rescue medications: albuterol  4 puffs every 4-6 hours as needed - Asthma control goals:  * Full participation in all desired activities (may need albuterol  before activity) * Albuterol  use two time or less a week on average (not counting use with activity) * Cough interfering with sleep  two time or less a month * Oral steroids no more than once a year * No hospitalizations  7. Recurrent infections - Thankfully, your immune system all looked great for the most part. - You did have an elevated CD4 T cell count (but we worry more about lower numbers here). - You were protective against Streptococcus pneumoniae as well as Tetanus and Diptheria, which is great news.  - We are going to get genetic testing to look for over 500 causes of immune dysregulation.  8. Return in about 3 months (around 11/28/2024). You can have the follow up appointment with Dr. Iva or a Nurse Practicioner (our Nurse Practitioners are excellent and always have Physician oversight!).    Subjective:   Sarahann Horrell is a 54 y.o. 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) 03/10/2021   Elevated alkaline phosphatase level 03/01/2021   PTSD (post-traumatic stress disorder) 03/01/2021   Abnormal stress echo 10/26/2018   Radiculopathy 08/26/2018   Obstructive sleep apnea syndrome 03/16/2017    Abscess of Bartholin's gland 07/04/2013   Asthma 06/22/2013   Complicated migraine 06/22/2013   History of hysterectomy for benign disease 06/22/2013   Hypothyroidism 06/22/2013   Ulcerative colitis (HCC) 06/22/2013    History obtained from: chart review and patient.  Discussed the use of AI scribe software for clinical note transcription with the patient and/or guardian, who gave verbal consent to proceed.  Orra is a 54 y.o. female presenting for a follow up visit.  She was last seen in December 2025.  At that time, we talked about having Xolair or Rhapsido under her regimen.  We continued her with Allegra and Pepcid  in the morning and Xyzal  and Pepcid  at night.  We obtain labs to look for mast cell activation syndrome.  She continue to avoid penicillin .  She also continue to avoid shellfish.  For her rhinitis, she continue with antihistamines.  Asthma was under good control with Breo 100 mcg 1 puff twice daily.  We did obtain labs to look at her immune system.  Her labs were notable for excellent protection against Streptococcus pneumonia with protective titers to 18 out of 23 serotypes.  She had an elevated number of CD4 T helper cells which did increase her ratio slightly.  Alpha gal panel was negative.  She has elevated CD4 T cell counts, which have decreased slightly from 1789 to 1680 over the past month. She is unsure of the significance of these results. Her immunoglobulin levels are normal, and she has protective antibodies against diphtheria, tetanus, and streptococcus pneumonia, likely due to a pneumonia vaccine received last year.  She experiences ongoing symptoms of hives and pain, which have not been attributed to a specific cause. She reports that her gastroenterologist told her she has ulcerative colitis. She continues to take antihistamines, including Allegra, Pepcid , and Xyzal , which do not cause additional fatigue beyond her baseline tiredness. She has a history of allergic  reactions, including a significant reaction to braces as a child.  She mentions a recent increase in medication costs due to changes in insurance coverage, specifically with [employer], which no longer covers certain medications as it did previously. She has met her insurance deductible for the year due to a recent procedure costing $2100.  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     Otherwise, there have been no changes to her past medical history, surgical history, family history, or social history.    Review of systems otherwise negative other than that mentioned in the HPI.    Objective:   Blood pressure 128/82, pulse 72, temperature 97.9 F (36.6 C), temperature source Temporal, resp. rate 20, SpO2 100%. There is no height or weight on file to calculate BMI.    Physical Exam Vitals reviewed.  Constitutional:      Appearance: She is well-developed.     Comments: Talkative.  HENT:     Head: Normocephalic and atraumatic.     Right Ear: Tympanic membrane, ear canal and external ear normal. No drainage, swelling or tenderness. Tympanic membrane is not injected, scarred, erythematous, retracted or bulging.     Left Ear: Tympanic membrane, ear canal and external ear normal. No drainage, swelling or tenderness. Tympanic membrane is not  injected, scarred, erythematous, retracted or bulging.     Nose: No nasal deformity, septal deviation, mucosal edema or rhinorrhea.     Right Turbinates: Enlarged, swollen and pale.     Left Turbinates: Enlarged, swollen and pale.     Right Sinus: No maxillary sinus tenderness or frontal sinus tenderness.     Left Sinus: No maxillary sinus tenderness or frontal sinus tenderness.     Mouth/Throat:     Mouth: Mucous membranes are not pale and not dry.     Pharynx: Uvula midline.  Eyes:     General: Lids are normal. Allergic shiner present.        Right eye: No discharge.        Left eye: No discharge.     Conjunctiva/sclera:  Conjunctivae normal.     Right eye: Right conjunctiva is not injected. No chemosis.    Left eye: Left conjunctiva is not injected. No chemosis.    Pupils: Pupils are equal, round, and reactive to light.  Cardiovascular:     Rate and Rhythm: Normal rate and regular rhythm.     Heart sounds: Normal heart sounds.  Pulmonary:     Effort: Pulmonary effort is normal. No tachypnea, accessory muscle usage or respiratory distress.     Breath sounds: Normal breath sounds. No wheezing, rhonchi or rales.     Comments: No crackles or wheezes noted.  Chest:     Chest wall: No tenderness.  Lymphadenopathy:     Head:     Right side of head: No submandibular, tonsillar or occipital adenopathy.     Left side of head: No submandibular, tonsillar or occipital adenopathy.     Cervical: No cervical adenopathy.  Skin:    General: Skin is warm.     Capillary Refill: Capillary refill takes less than 2 seconds.     Coloration: Skin is not pale.     Findings: Rash present. No abrasion, erythema or petechiae. Rash is not papular, urticarial or vesicular.     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   Neurological:     Mental Status: She is alert.  Psychiatric:        Behavior: Behavior is cooperative.      Diagnostic studies: labs sent instead      Marty Shaggy, MD  [facility]        "

## 2024-09-01 ENCOUNTER — Ambulatory Visit: Payer: Self-pay | Admitting: Allergy & Immunology

## 2024-09-01 ENCOUNTER — Encounter: Payer: Self-pay | Admitting: Allergy & Immunology

## 2024-09-07 ENCOUNTER — Ambulatory Visit: Admitting: Family Medicine

## 2024-09-15 ENCOUNTER — Telehealth: Payer: Self-pay | Admitting: *Deleted

## 2024-09-15 MED ORDER — RHAPSIDO 25 MG PO TABS: 25.0000 mg | ORAL_TABLET | Freq: Two times a day (BID) | ORAL | 11 refills | Status: AC

## 2024-09-15 NOTE — Telephone Encounter (Signed)
 L/m for patient to contact me to advise approval, copay card and submit to [employer] for Rhapsido 

## 2024-09-15 NOTE — Telephone Encounter (Signed)
-----   Message from Marty Shaggy, MD sent at 09/01/2024  9:08 AM EST ----- Rhapsido !!

## 2024-09-15 NOTE — Telephone Encounter (Signed)
Spoke to patient and advised approval, copay card and submit to [employer] 

## 2024-09-16 ENCOUNTER — Other Ambulatory Visit (HOSPITAL_COMMUNITY): Payer: Self-pay

## 2024-09-16 NOTE — Telephone Encounter (Signed)
 Thank you :)

## 2024-10-05 ENCOUNTER — Ambulatory Visit: Admitting: Internal Medicine

## 2024-11-29 ENCOUNTER — Ambulatory Visit: Admitting: Allergy & Immunology
